# Patient Record
Sex: Male | Born: 1975 | Race: White | Hispanic: No | Marital: Married | Smoking: Never smoker
Health system: Southern US, Community
[De-identification: ages and names within clinical notes are randomized; demographics above are authoritative.]

## PROBLEM LIST (undated history)

## (undated) DIAGNOSIS — I129 Hypertensive chronic kidney disease with stage 1 through stage 4 chronic kidney disease, or unspecified chronic kidney disease: Secondary | ICD-10-CM

## (undated) DIAGNOSIS — K625 Hemorrhage of anus and rectum: Secondary | ICD-10-CM

## (undated) DIAGNOSIS — G43109 Migraine with aura, not intractable, without status migrainosus: Secondary | ICD-10-CM

## (undated) DIAGNOSIS — E782 Mixed hyperlipidemia: Secondary | ICD-10-CM

## (undated) DIAGNOSIS — G43909 Migraine, unspecified, not intractable, without status migrainosus: Secondary | ICD-10-CM

## (undated) DIAGNOSIS — F329 Major depressive disorder, single episode, unspecified: Secondary | ICD-10-CM

## (undated) DIAGNOSIS — G4700 Insomnia, unspecified: Secondary | ICD-10-CM

## (undated) DIAGNOSIS — K219 Gastro-esophageal reflux disease without esophagitis: Secondary | ICD-10-CM

## (undated) DIAGNOSIS — M6740 Ganglion, unspecified site: Secondary | ICD-10-CM

## (undated) DIAGNOSIS — F32A Depression, unspecified: Secondary | ICD-10-CM

## (undated) DIAGNOSIS — F419 Anxiety disorder, unspecified: Secondary | ICD-10-CM

## (undated) HISTORY — DX: Hypertensive chronic kidney disease with stage 1 through stage 4 chronic kidney disease, or unspecified chronic kidney disease: I12.9

## (undated) HISTORY — DX: Insomnia, unspecified: G47.00

## (undated) HISTORY — DX: Migraine with aura, not intractable, without status migrainosus: G43.109

## (undated) HISTORY — PX: LEG AMPUTATION: SHX1105

## (undated) HISTORY — DX: Depression, unspecified: F32.A

## (undated) HISTORY — DX: Migraine, unspecified, not intractable, without status migrainosus: G43.909

## (undated) HISTORY — PX: GANGLION CYST EXCISION: SHX1691

## (undated) HISTORY — DX: Gastro-esophageal reflux disease without esophagitis: K21.9

## (undated) HISTORY — DX: Hemorrhage of anus and rectum: K62.5

## (undated) HISTORY — PX: TOOTH EXTRACTION: SUR596

## (undated) HISTORY — DX: Ganglion, unspecified site: M67.40

## (undated) HISTORY — DX: Mixed hyperlipidemia: E78.2

## (undated) HISTORY — PX: OTHER SURGICAL HISTORY: SHX169

## (undated) HISTORY — DX: Anxiety disorder, unspecified: F41.9

## (undated) HISTORY — PX: ABDOMINAL SURGERY: SHX537

---
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
## 2016-03-03 DIAGNOSIS — J209 Acute bronchitis, unspecified: Secondary | ICD-10-CM | POA: Diagnosis not present

## 2016-04-03 DIAGNOSIS — N182 Chronic kidney disease, stage 2 (mild): Secondary | ICD-10-CM | POA: Diagnosis not present

## 2016-04-03 DIAGNOSIS — E782 Mixed hyperlipidemia: Secondary | ICD-10-CM | POA: Diagnosis not present

## 2016-04-03 DIAGNOSIS — G43909 Migraine, unspecified, not intractable, without status migrainosus: Secondary | ICD-10-CM | POA: Diagnosis not present

## 2016-04-03 DIAGNOSIS — I129 Hypertensive chronic kidney disease with stage 1 through stage 4 chronic kidney disease, or unspecified chronic kidney disease: Secondary | ICD-10-CM | POA: Diagnosis not present

## 2016-11-14 DIAGNOSIS — G43119 Migraine with aura, intractable, without status migrainosus: Secondary | ICD-10-CM | POA: Diagnosis not present

## 2017-08-17 DIAGNOSIS — I129 Hypertensive chronic kidney disease with stage 1 through stage 4 chronic kidney disease, or unspecified chronic kidney disease: Secondary | ICD-10-CM | POA: Diagnosis not present

## 2017-08-17 DIAGNOSIS — E782 Mixed hyperlipidemia: Secondary | ICD-10-CM | POA: Diagnosis not present

## 2017-08-17 DIAGNOSIS — N182 Chronic kidney disease, stage 2 (mild): Secondary | ICD-10-CM | POA: Diagnosis not present

## 2017-08-17 DIAGNOSIS — R51 Headache: Secondary | ICD-10-CM | POA: Diagnosis not present

## 2017-08-23 DIAGNOSIS — Z6830 Body mass index (BMI) 30.0-30.9, adult: Secondary | ICD-10-CM | POA: Diagnosis not present

## 2017-08-23 DIAGNOSIS — G43909 Migraine, unspecified, not intractable, without status migrainosus: Secondary | ICD-10-CM | POA: Diagnosis not present

## 2017-09-17 DIAGNOSIS — E782 Mixed hyperlipidemia: Secondary | ICD-10-CM | POA: Diagnosis not present

## 2017-09-17 DIAGNOSIS — E669 Obesity, unspecified: Secondary | ICD-10-CM | POA: Diagnosis not present

## 2017-09-17 DIAGNOSIS — N182 Chronic kidney disease, stage 2 (mild): Secondary | ICD-10-CM | POA: Diagnosis not present

## 2017-09-17 DIAGNOSIS — I129 Hypertensive chronic kidney disease with stage 1 through stage 4 chronic kidney disease, or unspecified chronic kidney disease: Secondary | ICD-10-CM | POA: Diagnosis not present

## 2017-09-17 DIAGNOSIS — Z6830 Body mass index (BMI) 30.0-30.9, adult: Secondary | ICD-10-CM | POA: Diagnosis not present

## 2017-09-27 DIAGNOSIS — Z6830 Body mass index (BMI) 30.0-30.9, adult: Secondary | ICD-10-CM | POA: Diagnosis not present

## 2017-09-27 DIAGNOSIS — Z0000 Encounter for general adult medical examination without abnormal findings: Secondary | ICD-10-CM | POA: Diagnosis not present

## 2017-10-04 DIAGNOSIS — Z6830 Body mass index (BMI) 30.0-30.9, adult: Secondary | ICD-10-CM | POA: Diagnosis not present

## 2017-10-04 DIAGNOSIS — J069 Acute upper respiratory infection, unspecified: Secondary | ICD-10-CM | POA: Diagnosis not present

## 2017-10-04 DIAGNOSIS — R05 Cough: Secondary | ICD-10-CM | POA: Diagnosis not present

## 2018-01-31 DIAGNOSIS — N182 Chronic kidney disease, stage 2 (mild): Secondary | ICD-10-CM | POA: Diagnosis not present

## 2018-01-31 DIAGNOSIS — E782 Mixed hyperlipidemia: Secondary | ICD-10-CM | POA: Diagnosis not present

## 2018-01-31 DIAGNOSIS — I129 Hypertensive chronic kidney disease with stage 1 through stage 4 chronic kidney disease, or unspecified chronic kidney disease: Secondary | ICD-10-CM | POA: Diagnosis not present

## 2018-01-31 DIAGNOSIS — G43109 Migraine with aura, not intractable, without status migrainosus: Secondary | ICD-10-CM | POA: Diagnosis not present

## 2018-05-02 DIAGNOSIS — Z6829 Body mass index (BMI) 29.0-29.9, adult: Secondary | ICD-10-CM | POA: Diagnosis not present

## 2018-05-02 DIAGNOSIS — I129 Hypertensive chronic kidney disease with stage 1 through stage 4 chronic kidney disease, or unspecified chronic kidney disease: Secondary | ICD-10-CM | POA: Diagnosis not present

## 2018-05-02 DIAGNOSIS — R7301 Impaired fasting glucose: Secondary | ICD-10-CM | POA: Diagnosis not present

## 2018-05-02 DIAGNOSIS — N182 Chronic kidney disease, stage 2 (mild): Secondary | ICD-10-CM | POA: Diagnosis not present

## 2018-07-02 DIAGNOSIS — J069 Acute upper respiratory infection, unspecified: Secondary | ICD-10-CM | POA: Diagnosis not present

## 2018-07-02 DIAGNOSIS — R05 Cough: Secondary | ICD-10-CM | POA: Diagnosis not present

## 2018-09-12 DIAGNOSIS — R0602 Shortness of breath: Secondary | ICD-10-CM | POA: Diagnosis not present

## 2018-09-12 DIAGNOSIS — R05 Cough: Secondary | ICD-10-CM | POA: Diagnosis not present

## 2018-09-12 DIAGNOSIS — R0600 Dyspnea, unspecified: Secondary | ICD-10-CM | POA: Diagnosis not present

## 2018-09-12 DIAGNOSIS — J209 Acute bronchitis, unspecified: Secondary | ICD-10-CM | POA: Diagnosis not present

## 2018-10-03 DIAGNOSIS — Z1211 Encounter for screening for malignant neoplasm of colon: Secondary | ICD-10-CM | POA: Diagnosis not present

## 2018-10-03 DIAGNOSIS — Z1331 Encounter for screening for depression: Secondary | ICD-10-CM | POA: Diagnosis not present

## 2018-10-03 DIAGNOSIS — Z0000 Encounter for general adult medical examination without abnormal findings: Secondary | ICD-10-CM | POA: Diagnosis not present

## 2018-10-03 DIAGNOSIS — Z6830 Body mass index (BMI) 30.0-30.9, adult: Secondary | ICD-10-CM | POA: Diagnosis not present

## 2018-10-14 DIAGNOSIS — E782 Mixed hyperlipidemia: Secondary | ICD-10-CM | POA: Diagnosis not present

## 2018-10-14 DIAGNOSIS — I129 Hypertensive chronic kidney disease with stage 1 through stage 4 chronic kidney disease, or unspecified chronic kidney disease: Secondary | ICD-10-CM | POA: Diagnosis not present

## 2018-10-21 DIAGNOSIS — N182 Chronic kidney disease, stage 2 (mild): Secondary | ICD-10-CM | POA: Diagnosis not present

## 2018-10-21 DIAGNOSIS — E782 Mixed hyperlipidemia: Secondary | ICD-10-CM | POA: Diagnosis not present

## 2018-10-21 DIAGNOSIS — G43109 Migraine with aura, not intractable, without status migrainosus: Secondary | ICD-10-CM | POA: Diagnosis not present

## 2018-10-21 DIAGNOSIS — I129 Hypertensive chronic kidney disease with stage 1 through stage 4 chronic kidney disease, or unspecified chronic kidney disease: Secondary | ICD-10-CM | POA: Diagnosis not present

## 2018-12-31 DIAGNOSIS — J029 Acute pharyngitis, unspecified: Secondary | ICD-10-CM | POA: Diagnosis not present

## 2018-12-31 DIAGNOSIS — Z20818 Contact with and (suspected) exposure to other bacterial communicable diseases: Secondary | ICD-10-CM | POA: Diagnosis not present

## 2018-12-31 DIAGNOSIS — R6889 Other general symptoms and signs: Secondary | ICD-10-CM | POA: Diagnosis not present

## 2019-03-04 DIAGNOSIS — R1031 Right lower quadrant pain: Secondary | ICD-10-CM | POA: Diagnosis not present

## 2019-03-04 DIAGNOSIS — B354 Tinea corporis: Secondary | ICD-10-CM | POA: Diagnosis not present

## 2019-03-04 DIAGNOSIS — K529 Noninfective gastroenteritis and colitis, unspecified: Secondary | ICD-10-CM | POA: Diagnosis not present

## 2019-03-18 DIAGNOSIS — E782 Mixed hyperlipidemia: Secondary | ICD-10-CM | POA: Diagnosis not present

## 2019-03-18 DIAGNOSIS — I129 Hypertensive chronic kidney disease with stage 1 through stage 4 chronic kidney disease, or unspecified chronic kidney disease: Secondary | ICD-10-CM | POA: Diagnosis not present

## 2019-03-21 DIAGNOSIS — N182 Chronic kidney disease, stage 2 (mild): Secondary | ICD-10-CM | POA: Diagnosis not present

## 2019-03-21 DIAGNOSIS — R1084 Generalized abdominal pain: Secondary | ICD-10-CM | POA: Diagnosis not present

## 2019-03-21 DIAGNOSIS — I129 Hypertensive chronic kidney disease with stage 1 through stage 4 chronic kidney disease, or unspecified chronic kidney disease: Secondary | ICD-10-CM | POA: Diagnosis not present

## 2019-03-21 DIAGNOSIS — E782 Mixed hyperlipidemia: Secondary | ICD-10-CM | POA: Diagnosis not present

## 2019-03-21 DIAGNOSIS — Z7689 Persons encountering health services in other specified circumstances: Secondary | ICD-10-CM | POA: Diagnosis not present

## 2019-03-31 ENCOUNTER — Other Ambulatory Visit: Payer: Self-pay

## 2019-03-31 ENCOUNTER — Encounter: Payer: Self-pay | Admitting: Gastroenterology

## 2019-03-31 ENCOUNTER — Ambulatory Visit (INDEPENDENT_AMBULATORY_CARE_PROVIDER_SITE_OTHER): Payer: BC Managed Care – PPO | Admitting: Gastroenterology

## 2019-03-31 VITALS — Ht 69.5 in | Wt 189.0 lb

## 2019-03-31 DIAGNOSIS — K625 Hemorrhage of anus and rectum: Secondary | ICD-10-CM

## 2019-03-31 DIAGNOSIS — Z8371 Family history of colonic polyps: Secondary | ICD-10-CM | POA: Diagnosis not present

## 2019-03-31 DIAGNOSIS — R1030 Lower abdominal pain, unspecified: Secondary | ICD-10-CM | POA: Diagnosis not present

## 2019-03-31 DIAGNOSIS — K580 Irritable bowel syndrome with diarrhea: Secondary | ICD-10-CM

## 2019-03-31 NOTE — Patient Instructions (Signed)
If you are age 44 or older, your body mass index should be between 23-30. Your Body mass index is 27.51 kg/m. If this is out of the aforementioned range listed, please consider follow up with your Primary Care Provider.  If you are age 35 or younger, your body mass index should be between 19-25. Your Body mass index is 27.51 kg/m. If this is out of the aformentioned range listed, please consider follow up with your Primary Care Provider.   You have been scheduled for an endoscopy. Please follow written instructions given to you at your visit today. If you use inhalers (even only as needed), please bring them with you on the day of your procedure. Your physician has requested that you go to www.startemmi.com and enter the access code given to you at your visit today. This web site gives a general overview about your procedure. However, you should still follow specific instructions given to you by our office regarding your preparation for the procedure.  Thank you,  Dr. Jackquline Denmark

## 2019-03-31 NOTE — Progress Notes (Addendum)
Chief Complaint: Abdominal pain, intermittent diarrhea, rectal bleeding  Referring Provider:  Abner GreenspanHodges, Beth, MD      ASSESSMENT AND PLAN;   #1.  Lower abdominal pain with CT 03/04/2019 [facility] showing colitis.  Treated with antibiotics. #2.  Rectal bleeding. D/d hoids, AVMs, colitis, polyps, stercoral ulcers etc, r/o colonic neoplasms or IBD. #3.  IBS with predominant diarrhea. #4.  Family history of colonic polyps (dad and mom)  Plan: -Continue Bentyl 10 mg p.o. twice daily half an hour before lunch and dinner.  He would use an extra dose on as-needed basis. -Proceed with colonoscopy with MiraLAX preparation.  Have discussed risks and benefits.  He wishes to proceed. -Records from [facility] regarding recent CT, ED notes and labs.  [facility] reviewed: -CT 03/04/2019-mild colitis.  Labs showed WBC count of 12.3, hemoglobin 13.8, MCV 86.  CMP was normal except glucose 133 BUN 25 creatinine 0.9, normal lipase. HPI:    George Underwood is a 43 y.o. male  With longstanding GI problems Lower abdominal crampy pain " all his life", associated with abdominal bloating, diarrhea without nocturnal symptoms.  Se[facility] in [facility] ED [facility]/04/2019 [facility] with increased abdominal pain, subjective fevers and chills.  Underwent CT Abdo/pelvis which showed colitis.  He was treated with dicyclomine, ketorolac and Cipro/Flagyl.  Has felt better since.  Has been having intermittent rectal bleeding mostly on the tissue paper when he wipes.  Rarely mixed with the stool.  No melena or hematochezia.  Denies use of nonsteroidals on a regular basis.  Advised to get colonoscopy performed.  FH -Dad had " lot of colon polyps" at age above 4360. -Mom had polyps at age above 6060. Past Medical History:  Diagnosis Date  . Anxiety disorder   . Benign hypertension with chronic kidney disease   . Depression   . Insomnia   . Migraine   . Migraine with typical aura   . Mixed  hyperlipidemia     Past Surgical History:  Procedure Laterality Date  . GANGLION CYST EXCISION Right    Right wrist    Family History  Problem Relation Age of Onset  . Colon cancer Neg Hx     Social History   Tobacco Use  . Smoking status: Never Smoker  . Smokeless tobacco: Never Used  Substance Use Topics  . Alcohol use: Never    Frequency: Never  . Drug use: Not on file    Current Outpatient Medications  Medication Sig Dispense Refill  . atorvastatin (LIPITOR) 20 MG tablet Take 20 mg by mouth daily.    Marland Kitchen. dicyclomine (BENTYL) 10 MG capsule Take 10 mg by mouth 4 (four) times daily -  before meals and at bedtime.    Marland Kitchen. lisinopril (ZESTRIL) 40 MG tablet Take 40 mg by mouth daily.    . verapamil (CALAN) 40 MG tablet Take 40 mg by mouth 3 (three) times daily.     No current facility-administered medications for this visit.     Not on File  Review of Systems:  Constitutional: Denies fever, chills, diaphoresis, appetite change and fatigue.  HEENT: Denies photophobia, eye pain, redness, hearing loss, ear pain, congestion, sore throat, rhinorrhea, sneezing, mouth sores, neck pain, neck stiffness and tinnitus.   Respiratory: Denies SOB, DOE, cough, chest tightness,  and wheezing.   Cardiovascular: Denies chest pain, palpitations and leg swelling.  Genitourinary: Denies dysuria, urgency, frequency, hematuria, flank pain and difficulty urinating.  Musculoskeletal: Denies myalgias, back pain, joint  swelling, arthralgias and gait problem.  Skin: No rash.  Neurological: Denies dizziness, seizures, syncope, weakness, light-headedness, numbness and headaches.  Hematological: Denies adenopathy. Easy bruising, personal or family bleeding history  Psychiatric/Behavioral: Has anxiety or depression     Physical Exam:    Ht 5' 9.5" (1.765 m)   Wt 189 lb (85.7 kg)   BMI 27.51 kg/m  Filed Weights   03/31/19 1339  Weight: 189 lb (85.7 kg)   Constitutional:  Well-developed, in no  acute distress. Psychiatric: Normal mood and affect. Behavior is normal. televisit   Labs: -CMP BUN 19 creatinine 1.05, sodium 140, potassium 4.6, albumin 4.2, total bilirubin 0.2, AST 16, ALT 30.  I do not have the results of CBC at the present time.  According to the notes, it was normal.   This service was provided via telemedicine.  The patient was located at home.  The provider was located in work.  The patient did consent to this telephone visit and is aware of possible charges through their insurance for this visit.  The patient was referred by Dr. Nyra Capes.    Time spent on call/coordination of care/review of records: 56 min     Carmell Austria, MD 03/31/2019, 2:46 PM  Cc: Marco Collie, MD

## 2019-04-01 ENCOUNTER — Telehealth: Payer: Self-pay | Admitting: Gastroenterology

## 2019-04-01 ENCOUNTER — Encounter: Payer: Self-pay | Admitting: Gastroenterology

## 2019-04-01 DIAGNOSIS — H35711 Central serous chorioretinopathy, right eye: Secondary | ICD-10-CM | POA: Diagnosis not present

## 2019-04-01 NOTE — Telephone Encounter (Signed)
patient called said that yesterday he has a visit with dr. Lyndel Safe and was asked if he had any family Hx of colon cancer. Aunt in father side had colon cancer Grandfather on mother side colon cancer.

## 2019-04-01 NOTE — Telephone Encounter (Signed)
I have added to family history.

## 2019-04-18 ENCOUNTER — Encounter: Payer: Self-pay | Admitting: Gastroenterology

## 2019-04-28 ENCOUNTER — Telehealth: Payer: Self-pay | Admitting: Gastroenterology

## 2019-04-28 NOTE — Telephone Encounter (Signed)
pts wife called to cancle appt. She was exposed to covid.

## 2019-04-29 ENCOUNTER — Encounter: Payer: BC Managed Care – PPO | Admitting: Gastroenterology

## 2019-06-19 ENCOUNTER — Encounter: Payer: BC Managed Care – PPO | Admitting: Gastroenterology

## 2019-07-21 ENCOUNTER — Encounter: Payer: Self-pay | Admitting: Gastroenterology

## 2019-07-21 ENCOUNTER — Telehealth: Payer: Self-pay

## 2019-07-21 ENCOUNTER — Telehealth: Payer: Self-pay | Admitting: Gastroenterology

## 2019-07-21 NOTE — Telephone Encounter (Signed)
Covid-19 screening questions   Do you now or have you had a fever in the last 14 days?  Do you have any respiratory symptoms of shortness of breath or cough now or in the last 14 days?  Do you have any family members or close contacts with diagnosed or suspected Covid-19 in the past 14 days?  Have you been tested for Covid-19 and found to be positive?       

## 2019-07-21 NOTE — Telephone Encounter (Signed)
Please speak to wife Montesano.

## 2019-07-21 NOTE — Telephone Encounter (Signed)
Pt responded "no" to all screening questions °

## 2019-07-21 NOTE — Telephone Encounter (Signed)
Returned call,  pt's wife,  Jahzeel Poythress.  Pt requested we send copy of Miralax split dose pre to her work fax [phone].  Fax sent at 09:27.  Corky Sing

## 2019-07-22 ENCOUNTER — Other Ambulatory Visit: Payer: Self-pay

## 2019-07-22 ENCOUNTER — Encounter: Payer: Self-pay | Admitting: Gastroenterology

## 2019-07-22 ENCOUNTER — Ambulatory Visit (AMBULATORY_SURGERY_CENTER): Payer: BC Managed Care – PPO | Admitting: Gastroenterology

## 2019-07-22 VITALS — BP 107/76 | HR 70 | Temp 98.5°F | Resp 14 | Ht 69.0 in | Wt 189.0 lb

## 2019-07-22 DIAGNOSIS — K5730 Diverticulosis of large intestine without perforation or abscess without bleeding: Secondary | ICD-10-CM

## 2019-07-22 DIAGNOSIS — K5289 Other specified noninfective gastroenteritis and colitis: Secondary | ICD-10-CM | POA: Diagnosis not present

## 2019-07-22 DIAGNOSIS — K529 Noninfective gastroenteritis and colitis, unspecified: Secondary | ICD-10-CM | POA: Diagnosis not present

## 2019-07-22 DIAGNOSIS — K648 Other hemorrhoids: Secondary | ICD-10-CM

## 2019-07-22 DIAGNOSIS — R1030 Lower abdominal pain, unspecified: Secondary | ICD-10-CM

## 2019-07-22 MED ORDER — SODIUM CHLORIDE 0.9 % IV SOLN: 500.0000 mL | Freq: Once | INTRAVENOUS | Status: DC

## 2019-07-22 NOTE — Progress Notes (Signed)
PT taken to PACU. Monitors in place. VSS. Report given to RN. 

## 2019-07-22 NOTE — Patient Instructions (Signed)
Please read handouts provided. Continue present medications. Await pathology results. Return to GI clinic in 12 weeks. Repeat colonoscopy in 5 years.       YOU HAD AN ENDOSCOPIC PROCEDURE TODAY AT [facility]:   Refer to the procedure report that was given to you for any specific questions about what was found during the examination.  If the procedure report does not answer your questions, please call your gastroenterologist to clarify.  If you requested that your care partner not be given the details of your procedure findings, then the procedure report has been included in a sealed envelope for you to review at your convenience later.  YOU SHOULD EXPECT: Some feelings of bloating in the abdomen. Passage of more gas than usual.  Walking can help get rid of the air that was put into your GI tract during the procedure and reduce the bloating. If you had a lower endoscopy (such as a colonoscopy or flexible sigmoidoscopy) you may notice spotting of blood in your stool or on the toilet paper. If you underwent a bowel prep for your procedure, you may not have a normal bowel movement for a few days.  Please Note:  You might notice some irritation and congestion in your nose or some drainage.  This is from the oxygen used during your procedure.  There is no need for concern and it should clear up in a day or so.  SYMPTOMS TO REPORT IMMEDIATELY:   Following lower endoscopy (colonoscopy or flexible sigmoidoscopy):  Excessive amounts of blood in the stool  Significant tenderness or worsening of abdominal pains  Swelling of the abdomen that is new, acute  Fever of 100F or higher    For urgent or emergent issues, a gastroenterologist can be reached at any hour by calling [phone].   DIET:  We do recommend a small meal at first, but then you may proceed to your regular diet.  Drink plenty of fluids but you should avoid alcoholic beverages for 24 hours.  ACTIVITY:  You  should plan to take it easy for the rest of today and you should NOT DRIVE or use heavy machinery until tomorrow (because of the sedation medicines used during the test).    FOLLOW UP: Our staff will call the number listed on your records 48-72 hours following your procedure to check on you and address any questions or concerns that you may have regarding the information given to you following your procedure. If we do not reach you, we will leave a message.  We will attempt to reach you two times.  During this call, we will ask if you have developed any symptoms of COVID 19. If you develop any symptoms (ie: fever, flu-like symptoms, shortness of breath, cough etc.) before then, please call [phone].  If you test positive for Covid 19 in the 2 weeks post procedure, please call and report this information to [place].    If any biopsies were taken you will be contacted by phone or by letter within the next 1-3 weeks.  Please call us at [phone] if you have not heard about the biopsies in 3 weeks.    SIGNATURES/CONFIDENTIALITY: You and/or your care partner have signed paperwork which will be entered into your electronic medical record.  These signatures attest to the fact that that the information above on your After Visit Summary has been reviewed and is understood.  Full responsibility of the confidentiality of this discharge information lies with you and/or  your care-partner. 

## 2019-07-22 NOTE — Op Note (Signed)
[place] Patient Name: George Underwood Procedure Date: 07/22/2019 9:06 AM MRN: [id number] Endoscopist: Jackquline Denmark , MD Age: 43 Referring MD:  Date of Birth: [birth year] Gender: Male Account #: [id number] Procedure:                Colonoscopy Indications:              #1. Lower abdominal pain with CT 03/04/2019 [facility]                            showing colitis. Treated with antibiotics.                           #2. Rectal bleeding (resolved)                           #3. IBS with predominant diarrhea.                           #4. Family history of colonic polyps (dad and mom) Medicines:                Monitored Anesthesia Care Procedure:                Pre-Anesthesia Assessment:                           - Prior to the procedure, a History and Physical                            was performed, and patient medications and                            allergies were reviewed. The patient's tolerance of                            previous anesthesia was also reviewed. The risks                            and benefits of the procedure and the sedation                            options and risks were discussed with the patient.                            All questions were answered, and informed consent                            was obtained. Prior Anticoagulants: The patient has                            taken no previous anticoagulant or antiplatelet                            agents. ASA Grade Assessment: II - A patient with  mild systemic disease. After reviewing the risks                            and benefits, the patient was deemed in                            satisfactory condition to undergo the procedure.                           After obtaining informed consent, the colonoscope                            was passed under direct vision. Throughout the                            procedure, the patient's blood pressure, pulse, and                     oxygen saturations were monitored continuously. The                            Colonoscope was introduced through the anus and                            advanced to the 4 cm into the ileum. The                            colonoscopy was performed without difficulty. The                            patient tolerated the procedure well. The quality                            of the bowel preparation was good. The terminal                            ileum, ileocecal valve, appendiceal orifice, and                            rectum were photographed. Scope In: 9:15:44 AM Scope Out: 9:30:04 AM Scope Withdrawal Time: 0 hours 11 minutes 13 seconds  Total Procedure Duration: 0 hours 14 minutes 20 seconds  Findings:                 Patchy colitis was noted in the distal transverse                            colon, splenic flexure and proximal descending                            colon. Biopsies were taken with a cold forceps for                            histology. The remaining colonic mucosa was normal  with well preserved vascular pattern. Multiple                            biopsies were also taken from right colon                            (endoscopically normal) sent for pathology. The                            terminal ileum was normal. Multiple biopsies were                            also taken from terminal ileum and sent for                            histology.                           A few small-mouthed diverticula were found in the                            sigmoid colon.                           Non-bleeding internal hemorrhoids were found during                            retroflexion. The hemorrhoids were small.                           The exam was otherwise without abnormality on                            direct and retroflexion views. Complications:            No immediate complications. Estimated Blood Loss:     Estimated  blood loss: none. Impression:               -Patchy colitis predominantly in the splenic                            flexure (highly s/o resolving ischemic colitis).                           -Mild sigmoid diverticulosis.                           -Non-bleeding internal hemorrhoids.                           -Otherwise normal colonoscopy to TI. Recommendation:           - Patient has a contact number available for                            emergencies. The signs and symptoms of potential  delayed complications were discussed with the                            patient. Return to normal activities tomorrow.                            Written discharge instructions were provided to the                            patient.                           - Resume previous diet.                           - Continue present medications.                           - Await pathology results. At the present time,                            since he is significantly better and in light of                            above findings, conservative management for now. If                            he has any problems in the future would obtain CTA                            Abdo/pelvis.                           - Repeat colonoscopy in 5 years for screening                            purposes d/t strong family history of colonic                            polyps..                           - Return to GI clinic in 12 weeks.                           - Discussed above with the patient's wife. Lynann Bologna, MD 07/22/2019 9:45:26 AM This report has been signed electronically.

## 2019-07-22 NOTE — Progress Notes (Signed)
Called to room to assist during endoscopic procedure.  Patient ID and intended procedure confirmed with present staff. Received instructions for my participation in the procedure from the performing physician.  

## 2019-07-24 ENCOUNTER — Telehealth: Payer: Self-pay

## 2019-07-24 NOTE — Telephone Encounter (Signed)
Left message on answering machine. 

## 2019-07-27 ENCOUNTER — Encounter: Payer: Self-pay | Admitting: Gastroenterology

## 2019-09-30 DIAGNOSIS — I129 Hypertensive chronic kidney disease with stage 1 through stage 4 chronic kidney disease, or unspecified chronic kidney disease: Secondary | ICD-10-CM | POA: Diagnosis not present

## 2019-09-30 DIAGNOSIS — E782 Mixed hyperlipidemia: Secondary | ICD-10-CM | POA: Diagnosis not present

## 2019-10-13 DIAGNOSIS — I129 Hypertensive chronic kidney disease with stage 1 through stage 4 chronic kidney disease, or unspecified chronic kidney disease: Secondary | ICD-10-CM | POA: Diagnosis not present

## 2019-10-13 DIAGNOSIS — E782 Mixed hyperlipidemia: Secondary | ICD-10-CM | POA: Diagnosis not present

## 2019-10-13 DIAGNOSIS — N182 Chronic kidney disease, stage 2 (mild): Secondary | ICD-10-CM | POA: Diagnosis not present

## 2019-10-13 DIAGNOSIS — R7301 Impaired fasting glucose: Secondary | ICD-10-CM | POA: Diagnosis not present

## 2019-12-04 DIAGNOSIS — E669 Obesity, unspecified: Secondary | ICD-10-CM | POA: Diagnosis not present

## 2020-03-19 DIAGNOSIS — Z0000 Encounter for general adult medical examination without abnormal findings: Secondary | ICD-10-CM | POA: Diagnosis not present

## 2020-03-19 DIAGNOSIS — Z23 Encounter for immunization: Secondary | ICD-10-CM | POA: Diagnosis not present

## 2020-04-21 DIAGNOSIS — Z23 Encounter for immunization: Secondary | ICD-10-CM | POA: Diagnosis not present

## 2020-06-07 DIAGNOSIS — F419 Anxiety disorder, unspecified: Secondary | ICD-10-CM | POA: Diagnosis not present

## 2020-06-07 DIAGNOSIS — R319 Hematuria, unspecified: Secondary | ICD-10-CM | POA: Diagnosis not present

## 2020-06-07 DIAGNOSIS — Z6827 Body mass index (BMI) 27.0-27.9, adult: Secondary | ICD-10-CM | POA: Diagnosis not present

## 2020-06-07 DIAGNOSIS — Z202 Contact with and (suspected) exposure to infections with a predominantly sexual mode of transmission: Secondary | ICD-10-CM | POA: Diagnosis not present

## 2020-06-16 DIAGNOSIS — H35721 Serous detachment of retinal pigment epithelium, right eye: Secondary | ICD-10-CM | POA: Diagnosis not present

## 2020-09-07 DIAGNOSIS — Z435 Encounter for attention to cystostomy: Secondary | ICD-10-CM | POA: Diagnosis not present

## 2020-09-07 DIAGNOSIS — Z4803 Encounter for change or removal of drains: Secondary | ICD-10-CM | POA: Diagnosis not present

## 2020-09-07 DIAGNOSIS — K819 Cholecystitis, unspecified: Secondary | ICD-10-CM | POA: Diagnosis not present

## 2021-01-07 DIAGNOSIS — S88911D Complete traumatic amputation of right lower leg, level unspecified, subsequent encounter: Secondary | ICD-10-CM | POA: Diagnosis not present

## 2021-01-07 DIAGNOSIS — Z6824 Body mass index (BMI) 24.0-24.9, adult: Secondary | ICD-10-CM | POA: Diagnosis not present

## 2021-01-07 DIAGNOSIS — I468 Cardiac arrest due to other underlying condition: Secondary | ICD-10-CM | POA: Diagnosis not present

## 2021-01-07 DIAGNOSIS — Q550 Absence and aplasia of testis: Secondary | ICD-10-CM | POA: Diagnosis not present

## 2021-03-16 ENCOUNTER — Other Ambulatory Visit (HOSPITAL_BASED_OUTPATIENT_CLINIC_OR_DEPARTMENT_OTHER): Payer: Self-pay

## 2021-03-16 ENCOUNTER — Emergency Department (HOSPITAL_BASED_OUTPATIENT_CLINIC_OR_DEPARTMENT_OTHER)
Admission: EM | Admit: 2021-03-16 | Discharge: 2021-03-16 | Disposition: A | Discharge status: Home / Self Care | Payer: PRIVATE HEALTH INSURANCE | Attending: Emergency Medicine | Admitting: Emergency Medicine

## 2021-03-16 ENCOUNTER — Encounter (HOSPITAL_BASED_OUTPATIENT_CLINIC_OR_DEPARTMENT_OTHER): Payer: Self-pay

## 2021-03-16 ENCOUNTER — Other Ambulatory Visit: Payer: Self-pay

## 2021-03-16 DIAGNOSIS — I129 Hypertensive chronic kidney disease with stage 1 through stage 4 chronic kidney disease, or unspecified chronic kidney disease: Secondary | ICD-10-CM | POA: Insufficient documentation

## 2021-03-16 DIAGNOSIS — Z79899 Other long term (current) drug therapy: Secondary | ICD-10-CM | POA: Insufficient documentation

## 2021-03-16 DIAGNOSIS — R222 Localized swelling, mass and lump, trunk: Secondary | ICD-10-CM | POA: Diagnosis present

## 2021-03-16 DIAGNOSIS — L0390 Cellulitis, unspecified: Secondary | ICD-10-CM

## 2021-03-16 DIAGNOSIS — L03314 Cellulitis of groin: Secondary | ICD-10-CM | POA: Diagnosis not present

## 2021-03-16 DIAGNOSIS — N189 Chronic kidney disease, unspecified: Secondary | ICD-10-CM | POA: Diagnosis not present

## 2021-03-16 LAB — CBC WITH DIFFERENTIAL/PLATELET
Abs Immature Granulocytes: 0.03 10*3/uL (ref 0.00–0.07)
Basophils Absolute: 0.1 10*3/uL (ref 0.0–0.1)
Basophils Relative: 1 %
Eosinophils Absolute: 0.3 10*3/uL (ref 0.0–0.5)
Eosinophils Relative: 3 %
HCT: 34.2 % — ABNORMAL LOW (ref 39.0–52.0)
Hemoglobin: 12.2 g/dL — ABNORMAL LOW (ref 13.0–17.0)
Immature Granulocytes: 0 %
Lymphocytes Relative: 19 %
Lymphs Abs: 1.6 10*3/uL (ref 0.7–4.0)
MCH: 30.8 pg (ref 26.0–34.0)
MCHC: 35.7 g/dL (ref 30.0–36.0)
MCV: 86.4 fL (ref 80.0–100.0)
Monocytes Absolute: 0.9 10*3/uL (ref 0.1–1.0)
Monocytes Relative: 10 %
Neutro Abs: 5.8 10*3/uL (ref 1.7–7.7)
Neutrophils Relative %: 67 %
Platelets: 284 10*3/uL (ref 150–400)
RBC: 3.96 MIL/uL — ABNORMAL LOW (ref 4.22–5.81)
RDW: 11.9 % (ref 11.5–15.5)
WBC: 8.7 10*3/uL (ref 4.0–10.5)
nRBC: 0 % (ref 0.0–0.2)

## 2021-03-16 LAB — COMPREHENSIVE METABOLIC PANEL WITH GFR
ALT: 27 U/L (ref 0–44)
AST: 18 U/L (ref 15–41)
Albumin: 3.8 g/dL (ref 3.5–5.0)
Alkaline Phosphatase: 83 U/L (ref 38–126)
Anion gap: 6 (ref 5–15)
BUN: 21 mg/dL — ABNORMAL HIGH (ref 6–20)
CO2: 25 mmol/L (ref 22–32)
Calcium: 9.2 mg/dL (ref 8.9–10.3)
Chloride: 103 mmol/L (ref 98–111)
Creatinine, Ser: 0.81 mg/dL (ref 0.61–1.24)
GFR, Estimated: >60 mL/min
Glucose, Bld: 118 mg/dL — ABNORMAL HIGH (ref 70–99)
Potassium: 4.4 mmol/L (ref 3.5–5.1)
Sodium: 134 mmol/L — ABNORMAL LOW (ref 135–145)
Total Bilirubin: 0.3 mg/dL (ref 0.3–1.2)
Total Protein: 7.6 g/dL (ref 6.5–8.1)

## 2021-03-16 MED ORDER — SODIUM CHLORIDE 0.9 % IV BOLUS
1000.0000 mL | Freq: Once | INTRAVENOUS | Status: AC
  Administered 2021-03-16: 1000 mL via INTRAVENOUS

## 2021-03-16 MED ORDER — SODIUM CHLORIDE 0.9 % IV SOLN
2.0000 g | INTRAVENOUS | Status: DC
  Administered 2021-03-16: 2 g via INTRAVENOUS
  Filled 2021-03-16: qty 20

## 2021-03-16 MED ORDER — SULFAMETHOXAZOLE-TRIMETHOPRIM 800-160 MG PO TABS
1.0000 | ORAL_TABLET | Freq: Two times a day (BID) | ORAL | 0 refills | Status: AC
  Filled 2021-03-16: qty 20, 10d supply, fill #0

## 2021-03-16 MED ORDER — CEPHALEXIN 500 MG PO CAPS
500.0000 mg | ORAL_CAPSULE | Freq: Four times a day (QID) | ORAL | 0 refills | Status: AC
  Filled 2021-03-16: qty 40, 10d supply, fill #0

## 2021-03-16 NOTE — ED Triage Notes (Addendum)
Pt c/o pain and redness to right area above where right LE amputation-surgery was "the end of last year"-NAD-to triage in own w/c

## 2021-03-16 NOTE — ED Provider Notes (Addendum)
[facility] EMERGENCY DEPARTMENT Provider Note   CSN: [id number] Arrival date & time: 03/16/21  1122     History Chief Complaint  Patient presents with  . Wound Check    George Underwood is a 45 y.o. male.  The history is provided by the patient. No language interpreter was used.  Wound Check This is a new problem. The problem occurs constantly. The problem has been gradually worsening. Nothing aggravates the symptoms. Nothing relieves the symptoms. He has tried nothing for the symptoms. The treatment provided no relief.  Pt complains of swelling to right groin.  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  Pt reports area is red and swollen      Past Medical History:  Diagnosis Date  . Anxiety disorder   . Benign hypertension with chronic kidney disease   . Depression   . Ganglion cyst   . GERD (gastroesophageal reflux disease)   . Insomnia   . Migraine   . Migraine with typical aura   . Mixed hyperlipidemia   . Rectal bleeding     There are no problems to display for this patient.   Past Surgical History:  Procedure Laterality Date  . ABDOMINAL SURGERY    . GANGLION CYST EXCISION Right    Right wrist  . LEG AMPUTATION    . skin grafts    . TOOTH EXTRACTION         Family History  Problem Relation Age of Onset  . Colon cancer Maternal Grandfather   . Colon cancer Paternal Aunt     Social History   Tobacco Use  . Smoking status: Never Smoker  . Smokeless tobacco: Never Used  Vaping Use  . Vaping Use: Never used  Substance Use Topics  . Alcohol use: Never  . Drug use: Not Currently    Types: "Crack" cocaine    Home Medications Prior to Admission medications   Medication Sig Start Date End Date Taking? Authorizing Provider  cephALEXin (KEFLEX) 500 MG capsule Take 1 capsule (500 mg total) by mouth 4 (four) times daily for 10 days. 03/16/21 03/26/21 Yes Cheron Schaumann K, PA-C  sulfamethoxazole-trimethoprim (BACTRIM DS) 800-160  MG tablet Take 1 tablet by mouth 2 (two) times daily. 03/16/21  Yes Cheron Schaumann K, PA-C  atorvastatin (LIPITOR) 20 MG tablet Take 20 mg by mouth daily.    [provider]  [employer] MELATONIN 3 MG TABS tablet Take 6 mg by mouth at bedtime. 02/18/21   [provider]  [employer] PAIN RELIEF REGULAR ST 325 MG tablet Take 650 mg by mouth 3 (three) times daily. 01/14/21   [provider]  escitalopram (LEXAPRO) 10 MG tablet Take 1 tablet by mouth daily. 02/18/21   [provider]  gabapentin (NEURONTIN) 300 MG capsule Take 3 capsules by mouth 3 (three) times daily. 02/22/21   [provider]  lisinopril (ZESTRIL) 40 MG tablet Take 40 mg by mouth daily.    [provider]  methocarbamol (ROBAXIN) 500 MG tablet Take 1 tablet by mouth 4 (four) times daily as needed. 02/11/21   [provider]  Multiple Vitamin (ONE-DAILY MULTI-VITAMIN) TABS Take 1 tablet by mouth daily. 11/16/20   [provider]  oxyCODONE (OXY IR/ROXICODONE) 5 MG immediate release tablet SMARTSIG:1-1.5 Tablet(s) By Mouth Every 6 Hours PRN 01/14/21   [provider]  pantoprazole (PROTONIX) 40 MG tablet Take 1 tablet by mouth daily. 03/10/21   [provider]  rOPINIRole (REQUIP)  0.25 MG tablet Take 0.25 mg by mouth daily. 02/18/21   [provider]  verapamil (CALAN-SR) 240 MG CR tablet TK 1 T PO QHS 02/18/19   [provider]    Allergies    Patient has no known allergies.  Review of Systems   Review of Systems  All other systems reviewed and are negative.   Physical Exam Updated Vital Signs BP 122/77 (BP Location: Right Arm)   Pulse 89   Temp 98.8 F (37.1 C) (Oral)   Resp 18   SpO2 99%   Physical Exam Vitals reviewed.  Cardiovascular:     Rate and Rhythm: Normal rate.  Pulmonary:     Effort: Pulmonary effort is normal.  Abdominal:     Comments: Right groin area, redness to crease of abdomen/pelvis.  Skin graft no redness    Skin:    General: Skin is warm.  Neurological:     General: No focal deficit present.     Mental Status: He is alert.  Psychiatric:        Mood and Affect: Mood normal.     ED Results / Procedures / Treatments   Labs (all labs ordered are listed, but only abnormal results are displayed) Labs Reviewed  CBC WITH DIFFERENTIAL/PLATELET - Abnormal; Notable for the following components:      Result Value   RBC 3.96 (*)    Hemoglobin 12.2 (*)    HCT 34.2 (*)    All other components within normal limits  COMPREHENSIVE METABOLIC PANEL - Abnormal; Notable for the following components:   Sodium 134 (*)    Glucose, Bld 118 (*)    BUN 21 (*)    All other components within normal limits    EKG None  Radiology No results found.  Procedures Procedures   Medications Ordered in ED Medications  cefTRIAXone (ROCEPHIN) 2 g in sodium chloride 0.9 % 100 mL IVPB (0 g Intravenous Stopped 03/16/21 1341)  sodium chloride 0.9 % bolus 1,000 mL (0 mLs Intravenous Stopped 03/16/21 1347)    ED Course  I have reviewed the triage vital signs and the nursing notes.  Pertinent labs & imaging results that were available during my care of the patient were reviewed by me and considered in my medical decision making (see chart for details).    MDM Rules/Calculators/A&P                          MDM: Pt given Rocephin 2 grams IV.  Labs reviewed and are normal.  Pt advised to follow up with his surgeon tomorrow.  Final Clinical Impression(s) / ED Diagnoses Final diagnoses:  Cellulitis, unspecified cellulitis site    Rx / DC Orders ED Discharge Orders         Ordered    cephALEXin (KEFLEX) 500 MG capsule  4 times daily        03/16/21 1404    sulfamethoxazole-trimethoprim (BACTRIM DS) 800-160 MG tablet  2 times daily        03/16/21 1404        An After Visit Summary was printed and given to the patient.    Elson Areas, PA-C 03/16/21 1546    Elson Areas, PA-C 03/16/21 1550     Benjiman Core, MD 03/17/21 [phone]

## 2021-03-16 NOTE — Discharge Instructions (Signed)
See your Plastic surgeon for evaluation as soon as possible

## 2021-03-17 ENCOUNTER — Other Ambulatory Visit (HOSPITAL_BASED_OUTPATIENT_CLINIC_OR_DEPARTMENT_OTHER): Payer: Self-pay

## 2021-03-21 ENCOUNTER — Other Ambulatory Visit: Payer: Self-pay

## 2021-03-21 DIAGNOSIS — I129 Hypertensive chronic kidney disease with stage 1 through stage 4 chronic kidney disease, or unspecified chronic kidney disease: Secondary | ICD-10-CM | POA: Diagnosis not present

## 2021-03-21 DIAGNOSIS — L03115 Cellulitis of right lower limb: Secondary | ICD-10-CM | POA: Diagnosis not present

## 2021-03-21 DIAGNOSIS — N189 Chronic kidney disease, unspecified: Secondary | ICD-10-CM | POA: Insufficient documentation

## 2021-03-21 DIAGNOSIS — R2241 Localized swelling, mass and lump, right lower limb: Secondary | ICD-10-CM | POA: Diagnosis present

## 2021-03-21 DIAGNOSIS — Z79899 Other long term (current) drug therapy: Secondary | ICD-10-CM | POA: Diagnosis not present

## 2021-03-22 ENCOUNTER — Emergency Department (HOSPITAL_BASED_OUTPATIENT_CLINIC_OR_DEPARTMENT_OTHER)
Admission: EM | Admit: 2021-03-22 | Discharge: 2021-03-22 | Disposition: A | Discharge status: Home / Self Care | Payer: PRIVATE HEALTH INSURANCE | Attending: Emergency Medicine | Admitting: Emergency Medicine

## 2021-03-22 ENCOUNTER — Encounter (HOSPITAL_BASED_OUTPATIENT_CLINIC_OR_DEPARTMENT_OTHER): Payer: Self-pay | Admitting: Emergency Medicine

## 2021-03-22 ENCOUNTER — Emergency Department (HOSPITAL_BASED_OUTPATIENT_CLINIC_OR_DEPARTMENT_OTHER): Payer: PRIVATE HEALTH INSURANCE

## 2021-03-22 DIAGNOSIS — L03115 Cellulitis of right lower limb: Secondary | ICD-10-CM

## 2021-03-22 LAB — CBC WITH DIFFERENTIAL/PLATELET
Abs Immature Granulocytes: 0.06 10*3/uL (ref 0.00–0.07)
Basophils Absolute: 0.1 10*3/uL (ref 0.0–0.1)
Basophils Relative: 1 %
Eosinophils Absolute: 0.6 10*3/uL — ABNORMAL HIGH (ref 0.0–0.5)
Eosinophils Relative: 6 %
HCT: 33.3 % — ABNORMAL LOW (ref 39.0–52.0)
Hemoglobin: 11.6 g/dL — ABNORMAL LOW (ref 13.0–17.0)
Immature Granulocytes: 1 %
Lymphocytes Relative: 15 %
Lymphs Abs: 1.4 10*3/uL (ref 0.7–4.0)
MCH: 30.8 pg (ref 26.0–34.0)
MCHC: 34.8 g/dL (ref 30.0–36.0)
MCV: 88.3 fL (ref 80.0–100.0)
Monocytes Absolute: 1.5 10*3/uL — ABNORMAL HIGH (ref 0.1–1.0)
Monocytes Relative: 16 %
Neutro Abs: 5.8 10*3/uL (ref 1.7–7.7)
Neutrophils Relative %: 61 %
Platelets: 328 10*3/uL (ref 150–400)
RBC: 3.77 MIL/uL — ABNORMAL LOW (ref 4.22–5.81)
RDW: 12.3 % (ref 11.5–15.5)
WBC: 9.4 10*3/uL (ref 4.0–10.5)
nRBC: 0 % (ref 0.0–0.2)

## 2021-03-22 LAB — BASIC METABOLIC PANEL
Anion gap: 9 (ref 5–15)
BUN: 28 mg/dL — ABNORMAL HIGH (ref 6–20)
CO2: 22 mmol/L (ref 22–32)
Calcium: 8.5 mg/dL — ABNORMAL LOW (ref 8.9–10.3)
Chloride: 103 mmol/L (ref 98–111)
Creatinine, Ser: 1.21 mg/dL (ref 0.61–1.24)
GFR, Estimated: >60 mL/min (ref >60)
Glucose, Bld: 140 mg/dL — ABNORMAL HIGH (ref 70–99)
Potassium: 4.2 mmol/L (ref 3.5–5.1)
Sodium: 134 mmol/L — ABNORMAL LOW (ref 135–145)

## 2021-03-22 LAB — LACTIC ACID, PLASMA: Lactic Acid, Venous: 1.1 mmol/L (ref 0.5–1.9)

## 2021-03-22 MED ORDER — SODIUM CHLORIDE 0.9 % IV BOLUS
1000.0000 mL | Freq: Once | INTRAVENOUS | Status: AC
  Administered 2021-03-22: 1000 mL via INTRAVENOUS

## 2021-03-22 MED ORDER — MORPHINE SULFATE (PF) 4 MG/ML IV SOLN
4.0000 mg | Freq: Once | INTRAVENOUS | Status: AC
  Administered 2021-03-22: 4 mg via INTRAVENOUS
  Filled 2021-03-22: qty 1

## 2021-03-22 MED ORDER — ONDANSETRON HCL 4 MG/2ML IJ SOLN
4.0000 mg | Freq: Once | INTRAMUSCULAR | Status: AC
  Administered 2021-03-22: 4 mg via INTRAVENOUS
  Filled 2021-03-22: qty 2

## 2021-03-22 NOTE — Discharge Instructions (Signed)

## 2021-03-22 NOTE — ED Triage Notes (Signed)
Patient presents with complaints of redness to area above RLE amputation; states worse since seen here 5/25; states increased pain, swelling and redness to area. Denies fever.

## 2021-03-22 NOTE — ED Provider Notes (Signed)
[facility] EMERGENCY DEPARTMENT Provider Note   CSN: [id number] Arrival date & time: 03/21/21  2351     History Chief Complaint  Patient presents with  . Wound Check    George Underwood is a 45 y.o. male.  45 yo M with a cc of pain and swelling to a surgical incision site.  This been going on for about a week or so now.  Has been seen in this emergency department with instructions to follow-up with his surgeon however he has not followed up with them and after discussion with different nursing managers it was determined to come back here to have the area drained.  He had a skin graft to the area.  Had a crush injury that required hip disarticulation on that side.  Has had multiple surgeries to the other leg as well.  Most recent procedure to this area was done in January of this year.  No fevers.  No nausea.  Has been on Keflex and Bactrim without significant improvement and he thinks worsening.  The history is provided by the patient.  Wound Check This is a new problem. The current episode started 2 days ago. The problem occurs constantly. The problem has not changed since onset.Pertinent negatives include no chest pain, no abdominal pain, no headaches and no shortness of breath. Nothing aggravates the symptoms. Nothing relieves the symptoms. He has tried nothing for the symptoms. The treatment provided no relief.       Past Medical History:  Diagnosis Date  . Anxiety disorder   . Benign hypertension with chronic kidney disease   . Depression   . Ganglion cyst   . GERD (gastroesophageal reflux disease)   . Insomnia   . Migraine   . Migraine with typical aura   . Mixed hyperlipidemia   . Rectal bleeding     There are no problems to display for this patient.   Past Surgical History:  Procedure Laterality Date  . ABDOMINAL SURGERY    . GANGLION CYST EXCISION Right    Right wrist  . LEG AMPUTATION    . skin grafts    . TOOTH EXTRACTION         Family  History  Problem Relation Age of Onset  . Colon cancer Maternal Grandfather   . Colon cancer Paternal Aunt     Social History   Tobacco Use  . Smoking status: Never Smoker  . Smokeless tobacco: Never Used  Vaping Use  . Vaping Use: Never used  Substance Use Topics  . Alcohol use: Never  . Drug use: Not Currently    Types: "Crack" cocaine    Home Medications Prior to Admission medications   Medication Sig Start Date End Date Taking? Authorizing Provider  atorvastatin (LIPITOR) 20 MG tablet Take 20 mg by mouth daily.    [provider]  cephALEXin (KEFLEX) 500 MG capsule Take 1 capsule (500 mg total) by mouth 4 (four) times daily for 10 days. 03/16/21 03/26/21  Elson Areas, PA-C  [facility] MELATONIN 3 MG TABS tablet Take 6 mg by mouth at bedtime. 02/18/21   [provider]  [facility] PAIN RELIEF REGULAR ST 325 MG tablet Take 650 mg by mouth 3 (three) times daily. 01/14/21   [provider]  escitalopram (LEXAPRO) 10 MG tablet Take 1 tablet by mouth daily. 02/18/21   [provider]  gabapentin (NEURONTIN) 300 MG capsule Take 3 capsules by mouth 3 (three) times daily. 02/22/21   [provider]  lisinopril (ZESTRIL) 40 MG tablet Take 40 mg by mouth daily.    [provider]  methocarbamol (ROBAXIN) 500 MG tablet Take 1 tablet by mouth 4 (four) times daily as needed. 02/11/21   [provider]  Multiple Vitamin (ONE-DAILY MULTI-VITAMIN) TABS Take 1 tablet by mouth daily. 11/16/20   [provider]  oxyCODONE (OXY IR/ROXICODONE) 5 MG immediate release tablet SMARTSIG:1-1.5 Tablet(s) By Mouth Every 6 Hours PRN 01/14/21   [provider]  pantoprazole (PROTONIX) 40 MG tablet Take 1 tablet by mouth daily. 03/10/21   [provider]  rOPINIRole (REQUIP) 0.25 MG tablet Take 0.25 mg by mouth daily. 02/18/21   [provider]  sulfamethoxazole-trimethoprim (BACTRIM DS) 800-160 MG tablet Take 1 tablet by mouth 2 (two)  times daily. 03/16/21   Elson AreasSofia, Leslie K, PA-C  verapamil (CALAN-SR) 240 MG CR tablet TK 1 T PO QHS 02/18/19   [provider]    Allergies    Patient has no known allergies.  Review of Systems   Review of Systems  Constitutional: Negative for chills and fever.  HENT: Negative for congestion and facial swelling.   Eyes: Negative for discharge and visual disturbance.  Respiratory: Negative for shortness of breath.   Cardiovascular: Negative for chest pain and palpitations.  Gastrointestinal: Negative for abdominal pain, diarrhea and vomiting.  Musculoskeletal: Negative for arthralgias and myalgias.  Skin: Positive for color change. Negative for rash and wound.  Neurological: Negative for tremors, syncope and headaches.  Psychiatric/Behavioral: Negative for confusion and dysphoric mood.    Physical Exam Updated Vital Signs BP 123/76 (BP Location: Right Arm)   Pulse 91   Temp 99.2 F (37.3 C) (Oral)   Resp 18   Ht 5\' 9"  (1.753 m)   Wt 74.8 kg   SpO2 99%   BMI 24.37 kg/m   Physical Exam Vitals and nursing note reviewed.  Constitutional:      Appearance: He is well-developed.  HENT:     Head: Normocephalic and atraumatic.  Eyes:     Pupils: Pupils are equal, round, and reactive to light.  Neck:     Vascular: No JVD.  Cardiovascular:     Rate and Rhythm: Normal rate and regular rhythm.     Heart sounds: No murmur heard. No friction rub. No gallop.   Pulmonary:     Effort: No respiratory distress.     Breath sounds: No wheezing.  Abdominal:     General: There is no distension.     Tenderness: There is no guarding or rebound.  Genitourinary:   Musculoskeletal:        General: Normal range of motion.     Cervical back: Normal range of motion and neck supple.  Skin:    Coloration: Skin is not pale.     Findings: No rash.  Neurological:     Mental Status: He is alert and oriented to person, place, and time.  Psychiatric:        Behavior: Behavior normal.      ED Results / Procedures / Treatments   Labs (all labs ordered are listed, but only abnormal results are displayed) Labs Reviewed  CBC WITH DIFFERENTIAL/PLATELET - Abnormal; Notable for the following components:      Result Value   RBC 3.77 (*)    Hemoglobin 11.6 (*)    HCT 33.3 (*)    Monocytes Absolute 1.5 (*)    Eosinophils Absolute 0.6 (*)    All other components within normal  limits  BASIC METABOLIC PANEL - Abnormal; Notable for the following components:   Sodium 134 (*)    Glucose, Bld 140 (*)    BUN 28 (*)    Calcium 8.5 (*)    All other components within normal limits  CULTURE, BLOOD (ROUTINE X 2)  CULTURE, BLOOD (ROUTINE X 2)  LACTIC ACID, PLASMA    EKG None  Radiology CT PELVIS WO CONTRAST  Result Date: 03/22/2021 CLINICAL DATA:  Pain swelling redness to area superior to right lower extremity amputation EXAM: CT PELVIS WITHOUT CONTRAST TECHNIQUE: Multidetector CT imaging of the pelvis was performed following the standard protocol without intravenous contrast. COMPARISON:  CT 03/04/2019 FINDINGS: Urinary Tract:  The bladder is unremarkable. Bowel:  No pelvic bowel wall thickening. Vascular/Lymphatic: Nonaneurysmal aorta.  No suspicious nodes. Reproductive:  No mass or other significant abnormality Other: Negative for pelvic effusion. Small fat containing inguinal hernias. Musculoskeletal: Patient is status post amputation of the right lower extremity but the level of proximal thigh. Proximal right femur is absent. Irregular calcification at the right hip soft tissues consistent with dystrophic calcifications. Chronic fracture deformities of the right inferior and superior pubic rami. Screw fixation of bilateral SI joints. Heterogeneous lucency within the right acetabulum. Distorted soft tissues of the right hip, likely largely related to posttraumatic and or postsurgical scarring. Mild skin thickening and subcutaneous edema anteriorly on the right side without definitive  focal fluid collection. No soft tissue emphysema. IMPRESSION: 1. 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 Soft tissue distortion within the right hip presumably due to posttraumatic and or postsurgical scarring with presumed dystrophic calcifications at the right hip. Mild subcutaneous edema anteriorly. No gross focal fluid collections. There is heterogeneous lucency within the right acetabulum which may relate to osteopenia, no frank bony destructive change is seen. 2. Chronic right pelvic fracture deformities with screw fixation of bilateral SI joints Electronically Signed   By: Jasmine Pang M.D.   On: 03/22/2021 02:31    Procedures Procedures   Medications Ordered in ED Medications  sodium chloride 0.9 % bolus 1,000 mL (0 mLs Intravenous Stopped 03/22/21 0236)  morphine 4 MG/ML injection 4 mg (4 mg Intravenous Given 03/22/21 0136)  ondansetron (ZOFRAN) injection 4 mg (4 mg Intravenous Given 03/22/21 0137)    ED Course  I have reviewed the triage vital signs and the nursing notes.  Pertinent labs & imaging results that were available during my care of the patient were reviewed by me and considered in my medical decision making (see chart for details).    MDM Rules/Calculators/A&P                          45 yo M with a chief complaints of pain and swelling at the site of the prior skin graft.  Patient was seen earlier in the week with instructions to follow-up with his plastic surgeon however he has not followed up and had worsening and decided to come back to this emergency department.  He has an area that is most clinically is an abscess.  He is borderline febrile here with a temp of 100.3.  Will obtain a CT scan to evaluate for deep space infection.  CT scan with postsurgical changes.  No obvious deep space abscess.  Patient without leukocytosis lactate is normal.  Feeling much better on reassessment.  Patient could have a  superficial abscess though I am  a bit reluctant to drain this at bedside being so close to his skin graft.  I again encouraged him to follow-up with his plastic surgeon as was recommended to him 5 days ago.  We will have him continue his antibiotics.  5:18 AM:  I have discussed the diagnosis/risks/treatment options with the patient and family and believe the pt to be eligible for discharge home to follow-up with Plastics. We also discussed returning to the ED immediately if new or worsening sx occur. We discussed the sx which are most concerning (e.g., sudden worsening pain, fever, inability to tolerate by mouth) that necessitate immediate return. Medications administered to the patient during their visit and any new prescriptions provided to the patient are listed below.  Medications given during this visit Medications  sodium chloride 0.9 % bolus 1,000 mL (0 mLs Intravenous Stopped 03/22/21 0236)  morphine 4 MG/ML injection 4 mg (4 mg Intravenous Given 03/22/21 0136)  ondansetron (ZOFRAN) injection 4 mg (4 mg Intravenous Given 03/22/21 0137)     The patient appears reasonably screen and/or stabilized for discharge and I doubt any other medical condition or other [facility] requiring further screening, evaluation, or treatment in the ED at this time prior to discharge.   Final Clinical Impression(s) / ED Diagnoses Final diagnoses:  Cellulitis of right lower extremity    Rx / DC Orders ED Discharge Orders    None       Melene Plan, DO 03/22/21 0518

## 2021-03-27 LAB — CULTURE, BLOOD (ROUTINE X 2)
Culture: NO GROWTH
Culture: NO GROWTH
Specimen Description: ADEQUATE
Specimen Description: ADEQUATE

## 2021-05-10 ENCOUNTER — Emergency Department (HOSPITAL_BASED_OUTPATIENT_CLINIC_OR_DEPARTMENT_OTHER): Payer: Worker's Compensation

## 2021-05-10 ENCOUNTER — Other Ambulatory Visit: Payer: Self-pay

## 2021-05-10 ENCOUNTER — Emergency Department (HOSPITAL_BASED_OUTPATIENT_CLINIC_OR_DEPARTMENT_OTHER)
Admission: EM | Admit: 2021-05-10 | Discharge: 2021-05-10 | Disposition: A | Discharge status: Home / Self Care | Payer: Worker's Compensation | Attending: Emergency Medicine | Admitting: Emergency Medicine

## 2021-05-10 ENCOUNTER — Encounter (HOSPITAL_BASED_OUTPATIENT_CLINIC_OR_DEPARTMENT_OTHER): Payer: Self-pay | Admitting: *Deleted

## 2021-05-10 DIAGNOSIS — N189 Chronic kidney disease, unspecified: Secondary | ICD-10-CM | POA: Diagnosis not present

## 2021-05-10 DIAGNOSIS — M25551 Pain in right hip: Secondary | ICD-10-CM | POA: Diagnosis present

## 2021-05-10 DIAGNOSIS — I129 Hypertensive chronic kidney disease with stage 1 through stage 4 chronic kidney disease, or unspecified chronic kidney disease: Secondary | ICD-10-CM | POA: Diagnosis not present

## 2021-05-10 DIAGNOSIS — Z79899 Other long term (current) drug therapy: Secondary | ICD-10-CM | POA: Diagnosis not present

## 2021-05-10 DIAGNOSIS — W19XXXA Unspecified fall, initial encounter: Secondary | ICD-10-CM | POA: Diagnosis not present

## 2021-05-10 NOTE — ED Triage Notes (Signed)
C/o fall with right hip injury x 1 day ago

## 2021-05-10 NOTE — ED Provider Notes (Signed)
[facility] EMERGENCY DEPARTMENT Provider Note   CSN: [id number] Arrival date & time: 05/10/21  1926     History Chief Complaint  Patient presents with   George Underwood is a 45 y.o. male.   Fall   Patient fell on his crutches and hurt his right hip x1 day.  Pain is worse with ambulation, it is alleviated with Tylenol and ibuprofen.  He is status post right leg amputation.  Advised by [employer]. to come to the ED for assessment.  When he fell he did not hit his head.  He did not lose consciousness.  He is not on any blood thinners.   Past Medical History:  Diagnosis Date   Anxiety disorder    Benign hypertension with chronic kidney disease    Depression    Ganglion cyst    GERD (gastroesophageal reflux disease)    Insomnia    Migraine    Migraine with typical aura    Mixed hyperlipidemia    Rectal bleeding     There are no problems to display for this patient.   Past Surgical History:  Procedure Laterality Date   ABDOMINAL SURGERY     GANGLION CYST EXCISION Right    Right wrist   LEG AMPUTATION     skin grafts     TOOTH EXTRACTION         Family History  Problem Relation Age of Onset   Colon cancer Maternal Grandfather    Colon cancer Paternal Aunt     Social History   Tobacco Use   Smoking status: Never   Smokeless tobacco: Never  Vaping Use   Vaping Use: Never used  Substance Use Topics   Alcohol use: Never   Drug use: Not Currently    Types: "Crack" cocaine    Home Medications Prior to Admission medications   Medication Sig Start Date End Date Taking? Authorizing Provider  atorvastatin (LIPITOR) 20 MG tablet Take 20 mg by mouth daily.    [provider]  [facility] MELATONIN 3 MG TABS tablet Take 6 mg by mouth at bedtime. 02/18/21   [provider]  [facility] PAIN RELIEF REGULAR ST 325 MG tablet Take 650 mg by mouth 3 (three) times daily. 01/14/21   [provider]  escitalopram (LEXAPRO) 10 MG tablet  Take 1 tablet by mouth daily. 02/18/21   [provider]  gabapentin (NEURONTIN) 300 MG capsule Take 3 capsules by mouth 3 (three) times daily. 02/22/21   [provider]  lisinopril (ZESTRIL) 40 MG tablet Take 40 mg by mouth daily.    [provider]  methocarbamol (ROBAXIN) 500 MG tablet Take 1 tablet by mouth 4 (four) times daily as needed. 02/11/21   [provider]  Multiple Vitamin (ONE-DAILY MULTI-VITAMIN) TABS Take 1 tablet by mouth daily. 11/16/20   [provider]  oxyCODONE (OXY IR/ROXICODONE) 5 MG immediate release tablet SMARTSIG:1-1.5 Tablet(s) By Mouth Every 6 Hours PRN 01/14/21   [provider]  pantoprazole (PROTONIX) 40 MG tablet Take 1 tablet by mouth daily. 03/10/21   [provider]  rOPINIRole (REQUIP) 0.25 MG tablet Take 0.25 mg by mouth daily. 02/18/21   [provider]  sulfamethoxazole-trimethoprim (BACTRIM DS) 800-160 MG tablet Take 1 tablet by mouth 2 (two) times daily. 03/16/21   Elson Areas, PA-C  verapamil (CALAN-SR) 240 MG CR tablet TK 1 T PO QHS 02/18/19   [provider]    Allergies  Patient has no known allergies.  Review of Systems   Review of Systems  Musculoskeletal:        Right hip pain   Physical Exam Updated Vital Signs BP 126/80   Pulse 93   Temp 98.5 F (36.9 C) (Oral)   Resp 16   Ht 5\' 10"  (1.778 m)   Wt 88.5 kg   SpO2 97%   BMI 27.98 kg/m   Physical Exam Vitals and nursing note reviewed. Exam conducted with a chaperone present.  Constitutional:      General: He is not in acute distress.    Appearance: Normal appearance.  HENT:     Head: Normocephalic and atraumatic.  Eyes:     General: No scleral icterus.    Extraocular Movements: Extraocular movements intact.     Pupils: Pupils are equal, round, and reactive to light.  Musculoskeletal:     Comments: No TTP. No decreased ROM. No swelling or obvious deformities   Skin:    Coloration: Skin is not  jaundiced.  Neurological:     Mental Status: He is alert. Mental status is at baseline.     Coordination: Coordination normal.    ED Results / Procedures / Treatments   Labs (all labs ordered are listed, but only abnormal results are displayed) Labs Reviewed - No data to display  EKG None  Radiology DG Hip Unilat W or Wo Pelvis 2-3 Views Right  Result Date: 05/10/2021 CLINICAL DATA:  Right hip injury after fall. EXAM: DG HIP (WITH OR WITHOUT PELVIS) 2-3V RIGHT COMPARISON:  Mar 22, 2021. FINDINGS: Status post amputation of the right lower extremity including the femur. Continued presence of dystrophic calcifications seen in the soft tissues of the right hip. Status post surgical fusion of the sacroiliac joints bilaterally. No acute fracture or dislocation is noted. IMPRESSION: Stable chronic postsurgical and posttraumatic changes as described above no acute abnormality is noted. Electronically Signed   By: March 24, 2021 M.D.   On: 05/10/2021 20:26    Procedures Procedures   Medications Ordered in ED Medications - No data to display  ED Course  I have reviewed the triage vital signs and the nursing notes.  Pertinent labs & imaging results that were available during my care of the patient were reviewed by me and considered in my medical decision making (see chart for details).    MDM Rules/Calculators/A&P                           Patient vitals stable. No signs of fracture or dislocation. Patient appropriate for discharge.    Final Clinical Impression(s) / ED Diagnoses Final diagnoses:  None    Rx / DC Orders ED Discharge Orders     None        05/12/2021, PA-C 05/11/21 0006    05/13/21, DO 05/11/21 1522

## 2021-05-10 NOTE — Discharge Instructions (Addendum)
You are seen today for right hip pain.  The radiograph did not show any signs of acute fracture or injury.  Please continue taking Tylenol and ibuprofen as needed for pain.  If things change or worsen please return back to the ED for further evaluation.

## 2021-05-10 NOTE — ED Notes (Signed)
Reports fall yesterday from crutches, h/o right leg ampuation, sent from workers comp for [employer].  No redness or swelling noted, no bruising noted.

## 2021-05-10 NOTE — ED Notes (Signed)
ED Provider at bedside. 

## 2021-10-05 ENCOUNTER — Other Ambulatory Visit: Payer: Self-pay

## 2021-11-28 DIAGNOSIS — R2689 Other abnormalities of gait and mobility: Secondary | ICD-10-CM | POA: Diagnosis not present

## 2021-11-28 DIAGNOSIS — M79605 Pain in left leg: Secondary | ICD-10-CM | POA: Diagnosis not present

## 2021-11-30 DIAGNOSIS — R2689 Other abnormalities of gait and mobility: Secondary | ICD-10-CM | POA: Diagnosis not present

## 2021-11-30 DIAGNOSIS — M79605 Pain in left leg: Secondary | ICD-10-CM | POA: Diagnosis not present

## 2021-12-02 DIAGNOSIS — M79605 Pain in left leg: Secondary | ICD-10-CM | POA: Diagnosis not present

## 2021-12-02 DIAGNOSIS — E1169 Type 2 diabetes mellitus with other specified complication: Secondary | ICD-10-CM | POA: Diagnosis not present

## 2021-12-02 DIAGNOSIS — R2689 Other abnormalities of gait and mobility: Secondary | ICD-10-CM | POA: Diagnosis not present

## 2021-12-05 DIAGNOSIS — R2689 Other abnormalities of gait and mobility: Secondary | ICD-10-CM | POA: Diagnosis not present

## 2021-12-05 DIAGNOSIS — M79605 Pain in left leg: Secondary | ICD-10-CM | POA: Diagnosis not present

## 2021-12-06 DIAGNOSIS — R2689 Other abnormalities of gait and mobility: Secondary | ICD-10-CM | POA: Diagnosis not present

## 2021-12-06 DIAGNOSIS — M79605 Pain in left leg: Secondary | ICD-10-CM | POA: Diagnosis not present

## 2021-12-07 DIAGNOSIS — M79605 Pain in left leg: Secondary | ICD-10-CM | POA: Diagnosis not present

## 2021-12-07 DIAGNOSIS — R2689 Other abnormalities of gait and mobility: Secondary | ICD-10-CM | POA: Diagnosis not present

## 2021-12-08 DIAGNOSIS — I129 Hypertensive chronic kidney disease with stage 1 through stage 4 chronic kidney disease, or unspecified chronic kidney disease: Secondary | ICD-10-CM | POA: Diagnosis not present

## 2021-12-08 DIAGNOSIS — E1169 Type 2 diabetes mellitus with other specified complication: Secondary | ICD-10-CM | POA: Diagnosis not present

## 2021-12-08 DIAGNOSIS — N182 Chronic kidney disease, stage 2 (mild): Secondary | ICD-10-CM | POA: Diagnosis not present

## 2021-12-08 DIAGNOSIS — E782 Mixed hyperlipidemia: Secondary | ICD-10-CM | POA: Diagnosis not present

## 2021-12-13 DIAGNOSIS — R2689 Other abnormalities of gait and mobility: Secondary | ICD-10-CM | POA: Diagnosis not present

## 2021-12-13 DIAGNOSIS — M79605 Pain in left leg: Secondary | ICD-10-CM | POA: Diagnosis not present

## 2021-12-19 DIAGNOSIS — M79605 Pain in left leg: Secondary | ICD-10-CM | POA: Diagnosis not present

## 2021-12-19 DIAGNOSIS — R2689 Other abnormalities of gait and mobility: Secondary | ICD-10-CM | POA: Diagnosis not present

## 2021-12-21 DIAGNOSIS — R2689 Other abnormalities of gait and mobility: Secondary | ICD-10-CM | POA: Diagnosis not present

## 2021-12-21 DIAGNOSIS — M79605 Pain in left leg: Secondary | ICD-10-CM | POA: Diagnosis not present

## 2021-12-27 DIAGNOSIS — M79605 Pain in left leg: Secondary | ICD-10-CM | POA: Diagnosis not present

## 2021-12-27 DIAGNOSIS — R2689 Other abnormalities of gait and mobility: Secondary | ICD-10-CM | POA: Diagnosis not present

## 2022-01-20 DIAGNOSIS — Z89621 Acquired absence of right hip joint: Secondary | ICD-10-CM | POA: Diagnosis not present

## 2022-01-20 DIAGNOSIS — Z89611 Acquired absence of right leg above knee: Secondary | ICD-10-CM | POA: Diagnosis not present

## 2022-02-04 IMAGING — DX DG HIP (WITH OR WITHOUT PELVIS) 2-3V*R*
3 series · 3 of 3 positions shown · non-contrast
Comparison: March 22, 2021.

CLINICAL DATA: Right hip injury after fall.

EXAM:
DG HIP (WITH OR WITHOUT PELVIS) 2-3V RIGHT

[pelvis ap]
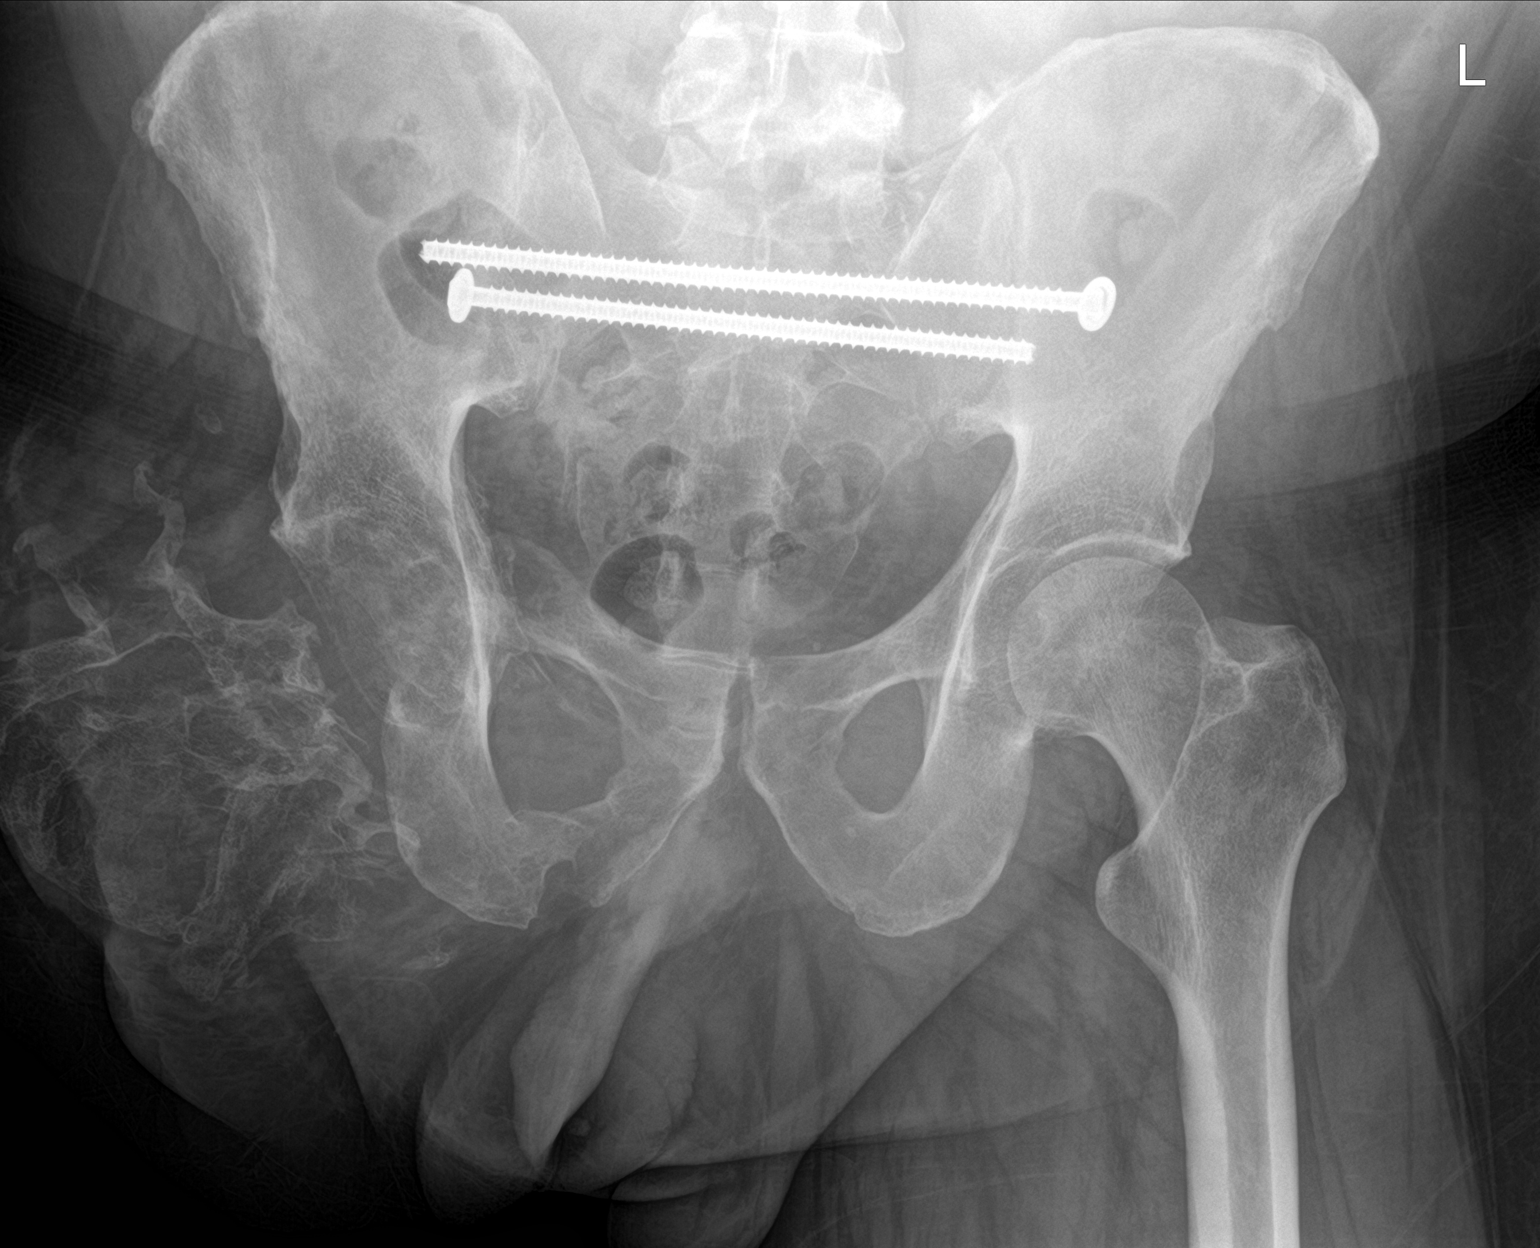

[hip ap]
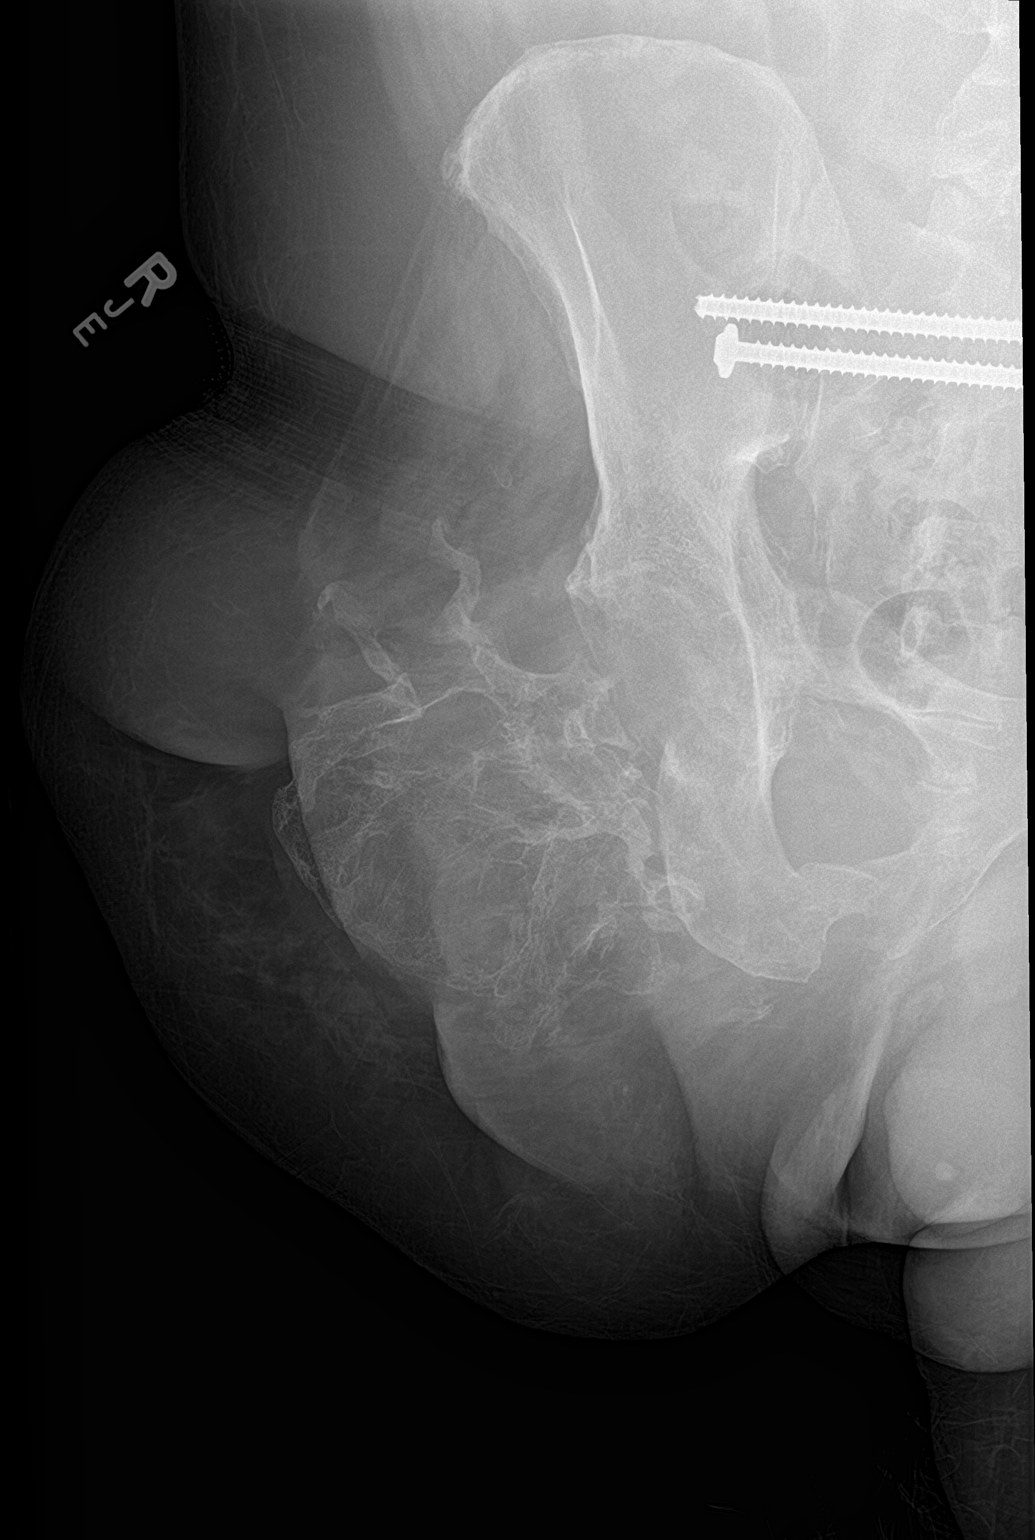

[hip lat]
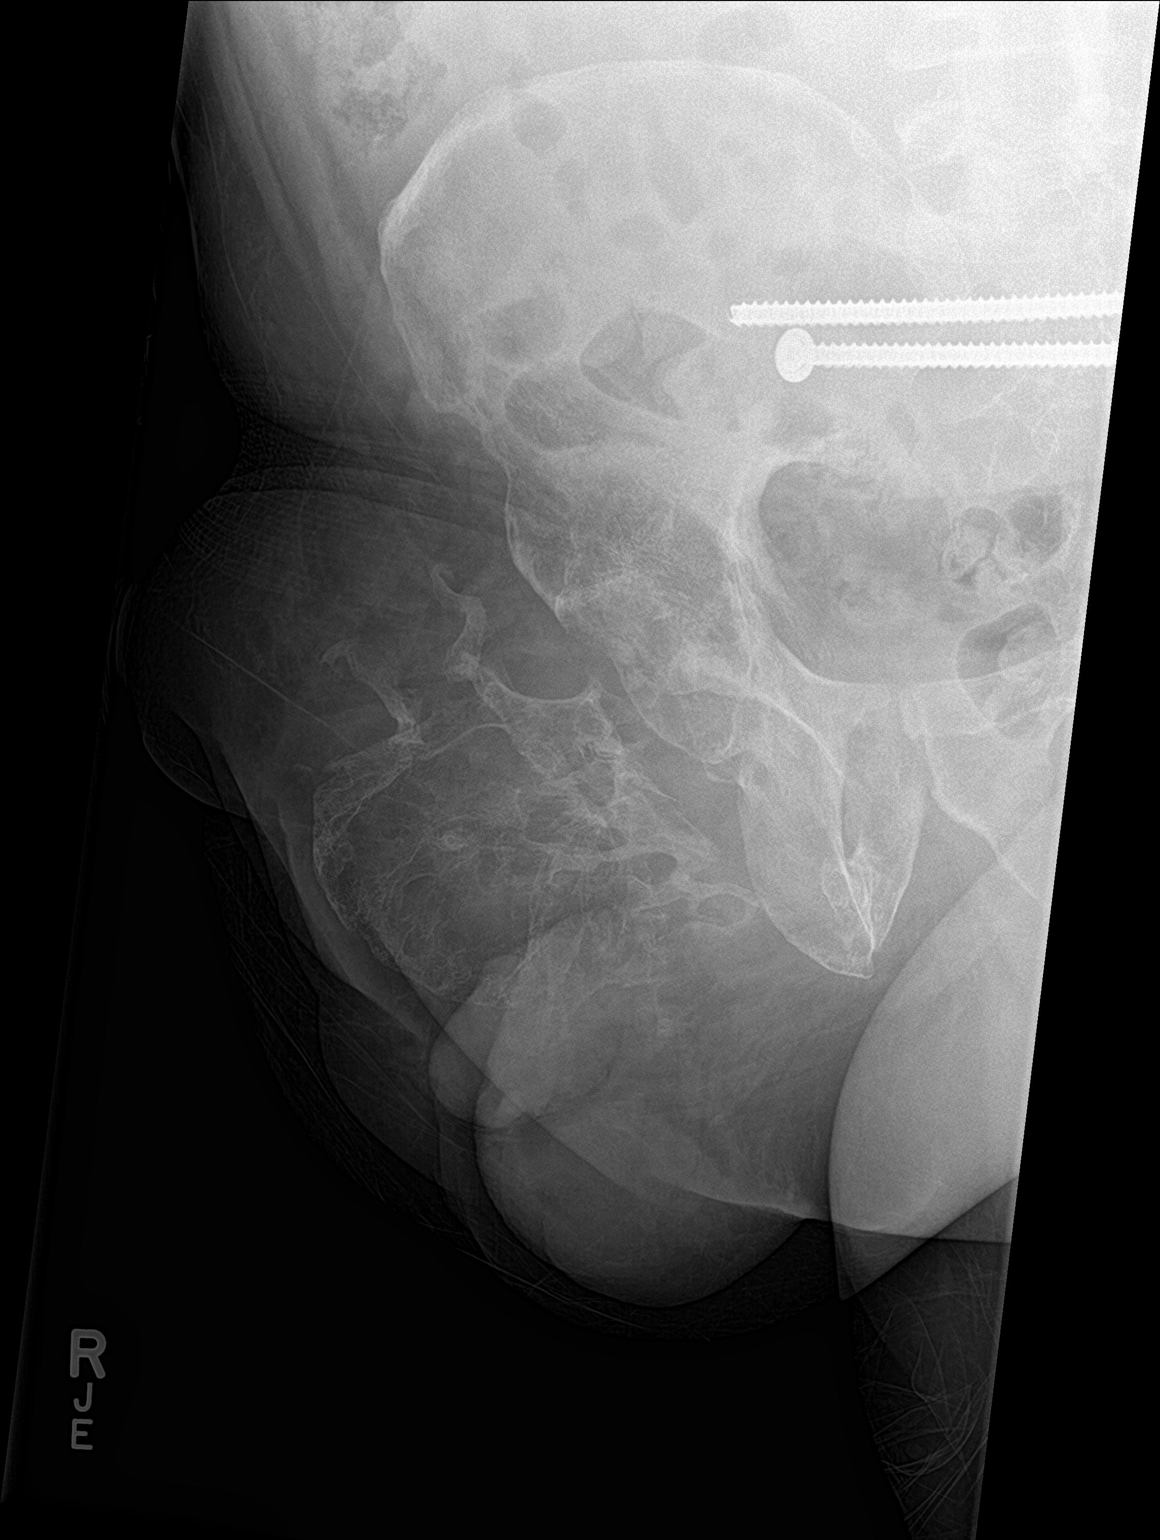

[3 of 3 positions shown; findings below may reference images not displayed]

FINDINGS: Status post amputation of the right lower extremity including the
femur. Continued presence of dystrophic calcifications seen in the
soft tissues of the right hip. Status post surgical fusion of the
sacroiliac joints bilaterally. No acute fracture or dislocation is
noted.
IMPRESSION: Stable chronic postsurgical and posttraumatic changes as described
above no acute abnormality is noted.

## 2022-02-15 DIAGNOSIS — S8781XD Crushing injury of right lower leg, subsequent encounter: Secondary | ICD-10-CM | POA: Diagnosis not present

## 2022-02-15 DIAGNOSIS — Z0289 Encounter for other administrative examinations: Secondary | ICD-10-CM | POA: Diagnosis not present

## 2022-03-08 DIAGNOSIS — E1169 Type 2 diabetes mellitus with other specified complication: Secondary | ICD-10-CM | POA: Diagnosis not present

## 2022-03-16 DIAGNOSIS — I129 Hypertensive chronic kidney disease with stage 1 through stage 4 chronic kidney disease, or unspecified chronic kidney disease: Secondary | ICD-10-CM | POA: Diagnosis not present

## 2022-03-16 DIAGNOSIS — E782 Mixed hyperlipidemia: Secondary | ICD-10-CM | POA: Diagnosis not present

## 2022-03-16 DIAGNOSIS — F419 Anxiety disorder, unspecified: Secondary | ICD-10-CM | POA: Diagnosis not present

## 2022-03-16 DIAGNOSIS — E1169 Type 2 diabetes mellitus with other specified complication: Secondary | ICD-10-CM | POA: Diagnosis not present

## 2022-03-23 DIAGNOSIS — M79605 Pain in left leg: Secondary | ICD-10-CM | POA: Diagnosis not present

## 2022-03-23 DIAGNOSIS — R2689 Other abnormalities of gait and mobility: Secondary | ICD-10-CM | POA: Diagnosis not present

## 2022-04-07 DIAGNOSIS — R2689 Other abnormalities of gait and mobility: Secondary | ICD-10-CM | POA: Diagnosis not present

## 2022-04-07 DIAGNOSIS — M79605 Pain in left leg: Secondary | ICD-10-CM | POA: Diagnosis not present

## 2022-05-09 DIAGNOSIS — E1169 Type 2 diabetes mellitus with other specified complication: Secondary | ICD-10-CM | POA: Diagnosis not present

## 2022-05-09 DIAGNOSIS — Z6829 Body mass index (BMI) 29.0-29.9, adult: Secondary | ICD-10-CM | POA: Diagnosis not present

## 2022-05-09 DIAGNOSIS — E782 Mixed hyperlipidemia: Secondary | ICD-10-CM | POA: Diagnosis not present

## 2022-05-09 DIAGNOSIS — I129 Hypertensive chronic kidney disease with stage 1 through stage 4 chronic kidney disease, or unspecified chronic kidney disease: Secondary | ICD-10-CM | POA: Diagnosis not present

## 2022-05-09 DIAGNOSIS — N182 Chronic kidney disease, stage 2 (mild): Secondary | ICD-10-CM | POA: Diagnosis not present

## 2022-07-13 DIAGNOSIS — E782 Mixed hyperlipidemia: Secondary | ICD-10-CM | POA: Diagnosis not present

## 2022-07-13 DIAGNOSIS — I129 Hypertensive chronic kidney disease with stage 1 through stage 4 chronic kidney disease, or unspecified chronic kidney disease: Secondary | ICD-10-CM | POA: Diagnosis not present

## 2022-07-13 DIAGNOSIS — E1169 Type 2 diabetes mellitus with other specified complication: Secondary | ICD-10-CM | POA: Diagnosis not present

## 2022-07-20 DIAGNOSIS — E782 Mixed hyperlipidemia: Secondary | ICD-10-CM | POA: Diagnosis not present

## 2022-07-20 DIAGNOSIS — N182 Chronic kidney disease, stage 2 (mild): Secondary | ICD-10-CM | POA: Diagnosis not present

## 2022-07-20 DIAGNOSIS — E1169 Type 2 diabetes mellitus with other specified complication: Secondary | ICD-10-CM | POA: Diagnosis not present

## 2022-07-20 DIAGNOSIS — I129 Hypertensive chronic kidney disease with stage 1 through stage 4 chronic kidney disease, or unspecified chronic kidney disease: Secondary | ICD-10-CM | POA: Diagnosis not present

## 2022-07-20 DIAGNOSIS — Z6829 Body mass index (BMI) 29.0-29.9, adult: Secondary | ICD-10-CM | POA: Diagnosis not present

## 2022-08-02 DIAGNOSIS — H35363 Drusen (degenerative) of macula, bilateral: Secondary | ICD-10-CM | POA: Diagnosis not present

## 2022-08-09 DIAGNOSIS — Z89611 Acquired absence of right leg above knee: Secondary | ICD-10-CM | POA: Diagnosis not present

## 2022-08-17 DIAGNOSIS — Z79891 Long term (current) use of opiate analgesic: Secondary | ICD-10-CM | POA: Diagnosis not present

## 2022-08-17 DIAGNOSIS — S8781XD Crushing injury of right lower leg, subsequent encounter: Secondary | ICD-10-CM | POA: Diagnosis not present

## 2022-09-08 DIAGNOSIS — Z89611 Acquired absence of right leg above knee: Secondary | ICD-10-CM | POA: Diagnosis not present

## 2022-09-08 DIAGNOSIS — S78021A Partial traumatic amputation at right hip joint, initial encounter: Secondary | ICD-10-CM | POA: Diagnosis not present

## 2022-11-14 DIAGNOSIS — I129 Hypertensive chronic kidney disease with stage 1 through stage 4 chronic kidney disease, or unspecified chronic kidney disease: Secondary | ICD-10-CM | POA: Diagnosis not present

## 2022-11-14 DIAGNOSIS — E782 Mixed hyperlipidemia: Secondary | ICD-10-CM | POA: Diagnosis not present

## 2022-11-14 DIAGNOSIS — E1169 Type 2 diabetes mellitus with other specified complication: Secondary | ICD-10-CM | POA: Diagnosis not present

## 2022-11-23 DIAGNOSIS — E1169 Type 2 diabetes mellitus with other specified complication: Secondary | ICD-10-CM | POA: Diagnosis not present

## 2022-11-23 DIAGNOSIS — Z6829 Body mass index (BMI) 29.0-29.9, adult: Secondary | ICD-10-CM | POA: Diagnosis not present

## 2022-11-23 DIAGNOSIS — E782 Mixed hyperlipidemia: Secondary | ICD-10-CM | POA: Diagnosis not present

## 2022-11-23 DIAGNOSIS — I129 Hypertensive chronic kidney disease with stage 1 through stage 4 chronic kidney disease, or unspecified chronic kidney disease: Secondary | ICD-10-CM | POA: Diagnosis not present

## 2022-11-23 DIAGNOSIS — N182 Chronic kidney disease, stage 2 (mild): Secondary | ICD-10-CM | POA: Diagnosis not present

## 2022-11-23 DIAGNOSIS — Z1331 Encounter for screening for depression: Secondary | ICD-10-CM | POA: Diagnosis not present

## 2022-11-30 DIAGNOSIS — Z89621 Acquired absence of right hip joint: Secondary | ICD-10-CM | POA: Diagnosis not present

## 2022-11-30 DIAGNOSIS — M79604 Pain in right leg: Secondary | ICD-10-CM | POA: Diagnosis not present

## 2022-11-30 DIAGNOSIS — R269 Unspecified abnormalities of gait and mobility: Secondary | ICD-10-CM | POA: Diagnosis not present

## 2023-02-21 DIAGNOSIS — Z0289 Encounter for other administrative examinations: Secondary | ICD-10-CM | POA: Diagnosis not present

## 2023-02-21 DIAGNOSIS — E782 Mixed hyperlipidemia: Secondary | ICD-10-CM | POA: Diagnosis not present

## 2023-02-21 DIAGNOSIS — S78011S Complete traumatic amputation at right hip joint, sequela: Secondary | ICD-10-CM | POA: Diagnosis not present

## 2023-02-21 DIAGNOSIS — E1169 Type 2 diabetes mellitus with other specified complication: Secondary | ICD-10-CM | POA: Diagnosis not present

## 2023-02-21 DIAGNOSIS — S78011D Complete traumatic amputation at right hip joint, subsequent encounter: Secondary | ICD-10-CM | POA: Diagnosis not present

## 2023-02-21 DIAGNOSIS — I129 Hypertensive chronic kidney disease with stage 1 through stage 4 chronic kidney disease, or unspecified chronic kidney disease: Secondary | ICD-10-CM | POA: Diagnosis not present

## 2023-03-01 DIAGNOSIS — E1169 Type 2 diabetes mellitus with other specified complication: Secondary | ICD-10-CM | POA: Diagnosis not present

## 2023-03-01 DIAGNOSIS — E782 Mixed hyperlipidemia: Secondary | ICD-10-CM | POA: Diagnosis not present

## 2023-03-01 DIAGNOSIS — N182 Chronic kidney disease, stage 2 (mild): Secondary | ICD-10-CM | POA: Diagnosis not present

## 2023-03-01 DIAGNOSIS — I129 Hypertensive chronic kidney disease with stage 1 through stage 4 chronic kidney disease, or unspecified chronic kidney disease: Secondary | ICD-10-CM | POA: Diagnosis not present

## 2023-03-01 DIAGNOSIS — Z6830 Body mass index (BMI) 30.0-30.9, adult: Secondary | ICD-10-CM | POA: Diagnosis not present

## 2023-03-12 DIAGNOSIS — N62 Hypertrophy of breast: Secondary | ICD-10-CM | POA: Diagnosis not present

## 2023-03-12 DIAGNOSIS — N6321 Unspecified lump in the left breast, upper outer quadrant: Secondary | ICD-10-CM | POA: Diagnosis not present

## 2023-03-21 DIAGNOSIS — Z89621 Acquired absence of right hip joint: Secondary | ICD-10-CM | POA: Diagnosis not present

## 2023-04-09 DIAGNOSIS — Z6830 Body mass index (BMI) 30.0-30.9, adult: Secondary | ICD-10-CM | POA: Diagnosis not present

## 2023-04-09 DIAGNOSIS — M25512 Pain in left shoulder: Secondary | ICD-10-CM | POA: Diagnosis not present

## 2023-05-22 DIAGNOSIS — Z1389 Encounter for screening for other disorder: Secondary | ICD-10-CM | POA: Diagnosis not present

## 2023-05-22 DIAGNOSIS — Z0000 Encounter for general adult medical examination without abnormal findings: Secondary | ICD-10-CM | POA: Diagnosis not present

## 2023-05-22 DIAGNOSIS — Z139 Encounter for screening, unspecified: Secondary | ICD-10-CM | POA: Diagnosis not present

## 2023-05-22 DIAGNOSIS — E782 Mixed hyperlipidemia: Secondary | ICD-10-CM | POA: Diagnosis not present

## 2023-05-22 DIAGNOSIS — Z1339 Encounter for screening examination for other mental health and behavioral disorders: Secondary | ICD-10-CM | POA: Diagnosis not present

## 2023-05-22 DIAGNOSIS — Z1331 Encounter for screening for depression: Secondary | ICD-10-CM | POA: Diagnosis not present

## 2023-05-22 DIAGNOSIS — Z6829 Body mass index (BMI) 29.0-29.9, adult: Secondary | ICD-10-CM | POA: Diagnosis not present

## 2023-05-22 DIAGNOSIS — E1169 Type 2 diabetes mellitus with other specified complication: Secondary | ICD-10-CM | POA: Diagnosis not present

## 2023-05-22 DIAGNOSIS — Z136 Encounter for screening for cardiovascular disorders: Secondary | ICD-10-CM | POA: Diagnosis not present

## 2023-06-11 DIAGNOSIS — E782 Mixed hyperlipidemia: Secondary | ICD-10-CM | POA: Diagnosis not present

## 2023-06-11 DIAGNOSIS — E1169 Type 2 diabetes mellitus with other specified complication: Secondary | ICD-10-CM | POA: Diagnosis not present

## 2023-06-11 DIAGNOSIS — I129 Hypertensive chronic kidney disease with stage 1 through stage 4 chronic kidney disease, or unspecified chronic kidney disease: Secondary | ICD-10-CM | POA: Diagnosis not present

## 2023-07-24 DIAGNOSIS — E782 Mixed hyperlipidemia: Secondary | ICD-10-CM | POA: Diagnosis not present

## 2023-07-24 DIAGNOSIS — I129 Hypertensive chronic kidney disease with stage 1 through stage 4 chronic kidney disease, or unspecified chronic kidney disease: Secondary | ICD-10-CM | POA: Diagnosis not present

## 2023-07-24 DIAGNOSIS — Z6830 Body mass index (BMI) 30.0-30.9, adult: Secondary | ICD-10-CM | POA: Diagnosis not present

## 2023-07-24 DIAGNOSIS — N182 Chronic kidney disease, stage 2 (mild): Secondary | ICD-10-CM | POA: Diagnosis not present

## 2023-07-24 DIAGNOSIS — E1169 Type 2 diabetes mellitus with other specified complication: Secondary | ICD-10-CM | POA: Diagnosis not present

## 2023-10-08 DIAGNOSIS — S78011D Complete traumatic amputation at right hip joint, subsequent encounter: Secondary | ICD-10-CM | POA: Diagnosis not present

## 2023-10-08 DIAGNOSIS — Z0289 Encounter for other administrative examinations: Secondary | ICD-10-CM | POA: Diagnosis not present

## 2023-10-25 DIAGNOSIS — E1169 Type 2 diabetes mellitus with other specified complication: Secondary | ICD-10-CM | POA: Diagnosis not present

## 2023-10-25 DIAGNOSIS — E782 Mixed hyperlipidemia: Secondary | ICD-10-CM | POA: Diagnosis not present

## 2023-10-25 DIAGNOSIS — Z125 Encounter for screening for malignant neoplasm of prostate: Secondary | ICD-10-CM | POA: Diagnosis not present

## 2023-10-25 DIAGNOSIS — I129 Hypertensive chronic kidney disease with stage 1 through stage 4 chronic kidney disease, or unspecified chronic kidney disease: Secondary | ICD-10-CM | POA: Diagnosis not present

## 2023-11-01 DIAGNOSIS — I129 Hypertensive chronic kidney disease with stage 1 through stage 4 chronic kidney disease, or unspecified chronic kidney disease: Secondary | ICD-10-CM | POA: Diagnosis not present

## 2023-11-01 DIAGNOSIS — E782 Mixed hyperlipidemia: Secondary | ICD-10-CM | POA: Diagnosis not present

## 2023-11-01 DIAGNOSIS — Z6830 Body mass index (BMI) 30.0-30.9, adult: Secondary | ICD-10-CM | POA: Diagnosis not present

## 2023-11-01 DIAGNOSIS — Z3009 Encounter for other general counseling and advice on contraception: Secondary | ICD-10-CM | POA: Diagnosis not present

## 2023-11-01 DIAGNOSIS — E1169 Type 2 diabetes mellitus with other specified complication: Secondary | ICD-10-CM | POA: Diagnosis not present

## 2023-11-01 DIAGNOSIS — N182 Chronic kidney disease, stage 2 (mild): Secondary | ICD-10-CM | POA: Diagnosis not present

## 2023-12-21 DIAGNOSIS — E782 Mixed hyperlipidemia: Secondary | ICD-10-CM | POA: Diagnosis not present

## 2023-12-21 DIAGNOSIS — E1169 Type 2 diabetes mellitus with other specified complication: Secondary | ICD-10-CM | POA: Diagnosis not present

## 2024-01-21 DIAGNOSIS — E1165 Type 2 diabetes mellitus with hyperglycemia: Secondary | ICD-10-CM | POA: Diagnosis not present

## 2024-02-20 DIAGNOSIS — E1165 Type 2 diabetes mellitus with hyperglycemia: Secondary | ICD-10-CM | POA: Diagnosis not present

## 2024-03-22 DIAGNOSIS — E1165 Type 2 diabetes mellitus with hyperglycemia: Secondary | ICD-10-CM | POA: Diagnosis not present

## 2024-04-03 DIAGNOSIS — E1169 Type 2 diabetes mellitus with other specified complication: Secondary | ICD-10-CM | POA: Diagnosis not present

## 2024-04-03 DIAGNOSIS — E782 Mixed hyperlipidemia: Secondary | ICD-10-CM | POA: Diagnosis not present

## 2024-04-03 DIAGNOSIS — I129 Hypertensive chronic kidney disease with stage 1 through stage 4 chronic kidney disease, or unspecified chronic kidney disease: Secondary | ICD-10-CM | POA: Diagnosis not present

## 2024-04-07 DIAGNOSIS — N182 Chronic kidney disease, stage 2 (mild): Secondary | ICD-10-CM | POA: Diagnosis not present

## 2024-04-07 DIAGNOSIS — Z6830 Body mass index (BMI) 30.0-30.9, adult: Secondary | ICD-10-CM | POA: Diagnosis not present

## 2024-04-07 DIAGNOSIS — Z1331 Encounter for screening for depression: Secondary | ICD-10-CM | POA: Diagnosis not present

## 2024-04-07 DIAGNOSIS — I129 Hypertensive chronic kidney disease with stage 1 through stage 4 chronic kidney disease, or unspecified chronic kidney disease: Secondary | ICD-10-CM | POA: Diagnosis not present

## 2024-04-07 DIAGNOSIS — Z0289 Encounter for other administrative examinations: Secondary | ICD-10-CM | POA: Diagnosis not present

## 2024-04-07 DIAGNOSIS — E1169 Type 2 diabetes mellitus with other specified complication: Secondary | ICD-10-CM | POA: Diagnosis not present

## 2024-04-07 DIAGNOSIS — E782 Mixed hyperlipidemia: Secondary | ICD-10-CM | POA: Diagnosis not present

## 2024-04-21 DIAGNOSIS — E1165 Type 2 diabetes mellitus with hyperglycemia: Secondary | ICD-10-CM | POA: Diagnosis not present

## 2024-05-22 DIAGNOSIS — E1165 Type 2 diabetes mellitus with hyperglycemia: Secondary | ICD-10-CM | POA: Diagnosis not present

## 2024-06-22 DIAGNOSIS — E1165 Type 2 diabetes mellitus with hyperglycemia: Secondary | ICD-10-CM | POA: Diagnosis not present

## 2024-07-01 DIAGNOSIS — E1169 Type 2 diabetes mellitus with other specified complication: Secondary | ICD-10-CM | POA: Diagnosis not present

## 2024-07-14 DIAGNOSIS — I129 Hypertensive chronic kidney disease with stage 1 through stage 4 chronic kidney disease, or unspecified chronic kidney disease: Secondary | ICD-10-CM | POA: Diagnosis not present

## 2024-07-14 DIAGNOSIS — N182 Chronic kidney disease, stage 2 (mild): Secondary | ICD-10-CM | POA: Diagnosis not present

## 2024-07-14 DIAGNOSIS — E782 Mixed hyperlipidemia: Secondary | ICD-10-CM | POA: Diagnosis not present

## 2024-07-14 DIAGNOSIS — E1169 Type 2 diabetes mellitus with other specified complication: Secondary | ICD-10-CM | POA: Diagnosis not present

## 2024-08-22 DIAGNOSIS — E1165 Type 2 diabetes mellitus with hyperglycemia: Secondary | ICD-10-CM | POA: Diagnosis not present

## 2024-09-21 DIAGNOSIS — E1165 Type 2 diabetes mellitus with hyperglycemia: Secondary | ICD-10-CM | POA: Diagnosis not present
